# Patient Record
Sex: Female | Born: 1945 | Race: White | Hispanic: No | Smoking: Former smoker
Health system: Southern US, Community
[De-identification: ages and names within clinical notes are randomized; demographics above are authoritative.]

## PROBLEM LIST (undated history)

## (undated) DIAGNOSIS — I679 Cerebrovascular disease, unspecified: Secondary | ICD-10-CM

## (undated) DIAGNOSIS — E7800 Pure hypercholesterolemia, unspecified: Secondary | ICD-10-CM

## (undated) DIAGNOSIS — E785 Hyperlipidemia, unspecified: Secondary | ICD-10-CM

## (undated) DIAGNOSIS — M1990 Unspecified osteoarthritis, unspecified site: Secondary | ICD-10-CM

## (undated) DIAGNOSIS — K219 Gastro-esophageal reflux disease without esophagitis: Secondary | ICD-10-CM

## (undated) DIAGNOSIS — C9111 Chronic lymphocytic leukemia of B-cell type in remission: Secondary | ICD-10-CM

## (undated) HISTORY — DX: Hyperlipidemia, unspecified: E78.5

## (undated) HISTORY — DX: Unspecified osteoarthritis, unspecified site: M19.90

## (undated) HISTORY — DX: Cerebrovascular disease, unspecified: I67.9

## (undated) HISTORY — DX: Chronic lymphocytic leukemia of B-cell type in remission: C91.11

## (undated) HISTORY — DX: Gastro-esophageal reflux disease without esophagitis: K21.9

## (undated) HISTORY — PX: BACK SURGERY: SHX140

---
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
## 2004-10-14 ENCOUNTER — Ambulatory Visit: Payer: Self-pay | Admitting: Internal Medicine

## 2005-01-13 ENCOUNTER — Ambulatory Visit: Payer: Self-pay | Admitting: Internal Medicine

## 2005-05-23 ENCOUNTER — Ambulatory Visit: Payer: Self-pay | Admitting: Internal Medicine

## 2005-08-08 ENCOUNTER — Ambulatory Visit (HOSPITAL_COMMUNITY): Admission: RE | Admit: 2005-08-08 | Discharge: 2005-08-08 | Payer: Self-pay | Admitting: Ophthalmology

## 2005-08-25 ENCOUNTER — Ambulatory Visit: Payer: Self-pay | Admitting: Internal Medicine

## 2006-08-28 ENCOUNTER — Ambulatory Visit (HOSPITAL_COMMUNITY): Admission: RE | Admit: 2006-08-28 | Discharge: 2006-08-28 | Payer: Self-pay | Admitting: Ophthalmology

## 2006-09-12 ENCOUNTER — Ambulatory Visit: Payer: Self-pay | Admitting: Internal Medicine

## 2010-08-24 NOTE — Assessment & Plan Note (Signed)
Jenna Rasmussen, [name]                CHART#:  [id number]   DATE:  09/12/2006                       DOB:  [birth year]   PROBLEM LIST:  1. GERD/laryngopharyngeal reflux diagnosed by Dr.  Vladimir Faster at [facility] previously on 24 hour pH probe.  2. History of CLL in remission.  3. History of cough, possibly related to number 1.  4. History of recurrent pulmonary infection.  5. Colonoscopy.  6. Negative colonoscopy, Dr.  Elmyra Ricks, 2005.   Jenna Rasmussen was diagnosed with laryngopharyngeal reflux by Dr.  Vladimir Faster  previously.  She has responded to acid suppression therapy, finally, she  has been on omeprazole 20 mg orally b.i.d.  We tried to drop it back  several months ago to once a day, but she had recurrence of her typical  reflux symptoms and some cough for which we backed up the omeprazole 20  mg orally twice daily.  She has gained 2-1/2 pounds, which is really not  our of range for her body frame.  She is currently doing well and not  really having and ENT or esophageal symptoms.   In looking back, she did have an esophageal pH probe placed some 4 years  ago when over at [facility], but is unsure whether or not she had EGD at  that time.  Again, she is not having any odynophagia or dysphagia, no  melena or rectal bleeding.   CURRENT MEDICATIONS:  See updated list.   ALLERGIES:  Flagyl, codeine, rifampin.   EXAMINATION:  She appears well.  Weight 135, height 5 foot 1-1/2 inches,  temperature 97.9, BP 100/80, pulse 70.  SKIN:  Warm and dry.  CHEST:  Lungs are clear to auscultation bilaterally.  CARDIAC EXAM:  Regular rate and rhythm without  murmurs, rubs, or  gallops.  ABDOMEN:  Nondistended, positive bowel sounds, soft, nontender, without  appreciable mass or organomegaly.   ASSESSMENT:  History of LPR, GERD, symptoms now controlled on b.i.d.  omeprazole.  I told her that if she could drop down to about 125 pounds,  she may be able to back off to once daily  omeprazole, otherwise, she  will be pretty much stuck with taking it twice daily.  We discussed the  risk of long term proton pump inhibitor therapy including increased risk  of infection and thinning of bones, but in this situation I feel that  the benefits of this approach far outweigh the risks.  I have encouraged  her to lose a little weight to see if she cannot get by with once daily  therapy.  We will plan to see this nice lady back in one year.  I have  given her a refill on her omeprazole today.       Jonathon Bellows, M.D.  Electronically Signed     RMR/MEDQ  D:  09/12/2006  T:  09/12/2006  Job:  [id number]   cc:   Selinda Flavin

## 2011-01-17 ENCOUNTER — Telehealth (INDEPENDENT_AMBULATORY_CARE_PROVIDER_SITE_OTHER): Payer: Self-pay | Admitting: *Deleted

## 2011-01-17 NOTE — Telephone Encounter (Signed)
Patient called in and said that she needs to cancel and reschedule her for 10/18.  She needs a Monday.  She can be reached today at [phone]

## 2011-01-17 NOTE — Telephone Encounter (Signed)
lmom for patient to call.

## 2011-01-18 NOTE — Telephone Encounter (Signed)
Spoke to patient and advised her Dr Karilyn Cota didn't do procedures on Monday and she stated "just cancel it then", TCS has been canceled

## 2011-01-27 ENCOUNTER — Encounter (INDEPENDENT_AMBULATORY_CARE_PROVIDER_SITE_OTHER): Payer: Self-pay | Admitting: Internal Medicine

## 2011-01-27 ENCOUNTER — Ambulatory Visit (HOSPITAL_COMMUNITY)
Admission: RE | Admit: 2011-01-27 | Payer: BC Managed Care – PPO | Source: Ambulatory Visit | Admitting: Internal Medicine

## 2011-01-27 ENCOUNTER — Encounter (HOSPITAL_COMMUNITY): Admission: RE | Payer: Self-pay | Source: Ambulatory Visit

## 2011-01-27 SURGERY — COLONOSCOPY
Anesthesia: Moderate Sedation

## 2011-07-28 DIAGNOSIS — H401390 Pigmentary glaucoma, unspecified eye, stage unspecified: Secondary | ICD-10-CM | POA: Diagnosis not present

## 2011-11-24 DIAGNOSIS — K219 Gastro-esophageal reflux disease without esophagitis: Secondary | ICD-10-CM | POA: Diagnosis not present

## 2011-11-24 DIAGNOSIS — S30860A Insect bite (nonvenomous) of lower back and pelvis, initial encounter: Secondary | ICD-10-CM | POA: Diagnosis not present

## 2011-11-24 DIAGNOSIS — W57XXXA Bitten or stung by nonvenomous insect and other nonvenomous arthropods, initial encounter: Secondary | ICD-10-CM | POA: Diagnosis not present

## 2011-12-27 DIAGNOSIS — N3090 Cystitis, unspecified without hematuria: Secondary | ICD-10-CM | POA: Diagnosis not present

## 2012-01-19 DIAGNOSIS — E785 Hyperlipidemia, unspecified: Secondary | ICD-10-CM | POA: Diagnosis not present

## 2012-01-19 DIAGNOSIS — R5383 Other fatigue: Secondary | ICD-10-CM | POA: Diagnosis not present

## 2012-01-19 DIAGNOSIS — E7800 Pure hypercholesterolemia, unspecified: Secondary | ICD-10-CM | POA: Diagnosis not present

## 2012-01-19 DIAGNOSIS — R5381 Other malaise: Secondary | ICD-10-CM | POA: Diagnosis not present

## 2012-01-24 DIAGNOSIS — H4011X0 Primary open-angle glaucoma, stage unspecified: Secondary | ICD-10-CM | POA: Diagnosis not present

## 2012-01-26 DIAGNOSIS — Z23 Encounter for immunization: Secondary | ICD-10-CM | POA: Diagnosis not present

## 2012-01-26 DIAGNOSIS — Z0000 Encounter for general adult medical examination without abnormal findings: Secondary | ICD-10-CM | POA: Diagnosis not present

## 2012-01-26 DIAGNOSIS — Z124 Encounter for screening for malignant neoplasm of cervix: Secondary | ICD-10-CM | POA: Diagnosis not present

## 2012-02-23 DIAGNOSIS — Z8619 Personal history of other infectious and parasitic diseases: Secondary | ICD-10-CM | POA: Diagnosis not present

## 2012-02-23 DIAGNOSIS — K219 Gastro-esophageal reflux disease without esophagitis: Secondary | ICD-10-CM | POA: Diagnosis not present

## 2012-02-23 DIAGNOSIS — J479 Bronchiectasis, uncomplicated: Secondary | ICD-10-CM | POA: Diagnosis not present

## 2012-02-23 DIAGNOSIS — C9110 Chronic lymphocytic leukemia of B-cell type not having achieved remission: Secondary | ICD-10-CM | POA: Diagnosis not present

## 2012-02-24 DIAGNOSIS — B300 Keratoconjunctivitis due to adenovirus: Secondary | ICD-10-CM | POA: Diagnosis not present

## 2012-03-12 DIAGNOSIS — Z1231 Encounter for screening mammogram for malignant neoplasm of breast: Secondary | ICD-10-CM | POA: Diagnosis not present

## 2012-04-18 DIAGNOSIS — H4011X0 Primary open-angle glaucoma, stage unspecified: Secondary | ICD-10-CM | POA: Diagnosis not present

## 2012-05-11 DIAGNOSIS — N390 Urinary tract infection, site not specified: Secondary | ICD-10-CM | POA: Diagnosis not present

## 2012-05-11 DIAGNOSIS — R300 Dysuria: Secondary | ICD-10-CM | POA: Diagnosis not present

## 2012-08-09 DIAGNOSIS — J479 Bronchiectasis, uncomplicated: Secondary | ICD-10-CM | POA: Diagnosis not present

## 2012-08-09 DIAGNOSIS — C9110 Chronic lymphocytic leukemia of B-cell type not having achieved remission: Secondary | ICD-10-CM | POA: Diagnosis not present

## 2012-08-09 DIAGNOSIS — K219 Gastro-esophageal reflux disease without esophagitis: Secondary | ICD-10-CM | POA: Diagnosis not present

## 2012-08-10 DIAGNOSIS — S93609A Unspecified sprain of unspecified foot, initial encounter: Secondary | ICD-10-CM | POA: Diagnosis not present

## 2012-08-10 DIAGNOSIS — IMO0002 Reserved for concepts with insufficient information to code with codable children: Secondary | ICD-10-CM | POA: Diagnosis not present

## 2012-08-16 ENCOUNTER — Encounter: Payer: BC Managed Care – PPO | Admitting: Internal Medicine

## 2012-08-16 DIAGNOSIS — C9110 Chronic lymphocytic leukemia of B-cell type not having achieved remission: Secondary | ICD-10-CM

## 2012-08-30 DIAGNOSIS — M1990 Unspecified osteoarthritis, unspecified site: Secondary | ICD-10-CM | POA: Diagnosis not present

## 2012-08-30 DIAGNOSIS — M79609 Pain in unspecified limb: Secondary | ICD-10-CM | POA: Diagnosis not present

## 2012-08-30 DIAGNOSIS — M76899 Other specified enthesopathies of unspecified lower limb, excluding foot: Secondary | ICD-10-CM | POA: Diagnosis not present

## 2012-09-17 DIAGNOSIS — N3090 Cystitis, unspecified without hematuria: Secondary | ICD-10-CM | POA: Diagnosis not present

## 2012-10-24 DIAGNOSIS — M76899 Other specified enthesopathies of unspecified lower limb, excluding foot: Secondary | ICD-10-CM | POA: Diagnosis not present

## 2012-10-24 DIAGNOSIS — M659 Synovitis and tenosynovitis, unspecified: Secondary | ICD-10-CM | POA: Diagnosis not present

## 2013-01-03 DIAGNOSIS — H4011X0 Primary open-angle glaucoma, stage unspecified: Secondary | ICD-10-CM | POA: Diagnosis not present

## 2013-01-14 DIAGNOSIS — Z23 Encounter for immunization: Secondary | ICD-10-CM | POA: Diagnosis not present

## 2013-02-01 DIAGNOSIS — IMO0001 Reserved for inherently not codable concepts without codable children: Secondary | ICD-10-CM | POA: Diagnosis not present

## 2013-02-01 DIAGNOSIS — M25559 Pain in unspecified hip: Secondary | ICD-10-CM | POA: Diagnosis not present

## 2013-02-06 DIAGNOSIS — IMO0001 Reserved for inherently not codable concepts without codable children: Secondary | ICD-10-CM | POA: Diagnosis not present

## 2013-02-06 DIAGNOSIS — M25559 Pain in unspecified hip: Secondary | ICD-10-CM | POA: Diagnosis not present

## 2013-02-08 DIAGNOSIS — M25559 Pain in unspecified hip: Secondary | ICD-10-CM | POA: Diagnosis not present

## 2013-02-08 DIAGNOSIS — IMO0001 Reserved for inherently not codable concepts without codable children: Secondary | ICD-10-CM | POA: Diagnosis not present

## 2013-02-11 DIAGNOSIS — M76899 Other specified enthesopathies of unspecified lower limb, excluding foot: Secondary | ICD-10-CM | POA: Diagnosis not present

## 2013-02-11 DIAGNOSIS — IMO0001 Reserved for inherently not codable concepts without codable children: Secondary | ICD-10-CM | POA: Diagnosis not present

## 2013-02-15 DIAGNOSIS — M76899 Other specified enthesopathies of unspecified lower limb, excluding foot: Secondary | ICD-10-CM | POA: Diagnosis not present

## 2013-02-15 DIAGNOSIS — IMO0001 Reserved for inherently not codable concepts without codable children: Secondary | ICD-10-CM | POA: Diagnosis not present

## 2013-02-18 DIAGNOSIS — IMO0001 Reserved for inherently not codable concepts without codable children: Secondary | ICD-10-CM | POA: Diagnosis not present

## 2013-02-18 DIAGNOSIS — M76899 Other specified enthesopathies of unspecified lower limb, excluding foot: Secondary | ICD-10-CM | POA: Diagnosis not present

## 2013-02-27 DIAGNOSIS — R05 Cough: Secondary | ICD-10-CM | POA: Diagnosis not present

## 2013-02-27 DIAGNOSIS — J040 Acute laryngitis: Secondary | ICD-10-CM | POA: Diagnosis not present

## 2013-02-27 DIAGNOSIS — J209 Acute bronchitis, unspecified: Secondary | ICD-10-CM | POA: Diagnosis not present

## 2013-02-28 DIAGNOSIS — IMO0001 Reserved for inherently not codable concepts without codable children: Secondary | ICD-10-CM | POA: Diagnosis not present

## 2013-02-28 DIAGNOSIS — M76899 Other specified enthesopathies of unspecified lower limb, excluding foot: Secondary | ICD-10-CM | POA: Diagnosis not present

## 2013-03-04 DIAGNOSIS — M76899 Other specified enthesopathies of unspecified lower limb, excluding foot: Secondary | ICD-10-CM | POA: Diagnosis not present

## 2013-03-04 DIAGNOSIS — IMO0001 Reserved for inherently not codable concepts without codable children: Secondary | ICD-10-CM | POA: Diagnosis not present

## 2013-03-05 DIAGNOSIS — IMO0001 Reserved for inherently not codable concepts without codable children: Secondary | ICD-10-CM | POA: Diagnosis not present

## 2013-03-05 DIAGNOSIS — M76899 Other specified enthesopathies of unspecified lower limb, excluding foot: Secondary | ICD-10-CM | POA: Diagnosis not present

## 2013-03-12 DIAGNOSIS — IMO0001 Reserved for inherently not codable concepts without codable children: Secondary | ICD-10-CM | POA: Diagnosis not present

## 2013-03-12 DIAGNOSIS — M25569 Pain in unspecified knee: Secondary | ICD-10-CM | POA: Diagnosis not present

## 2013-03-25 DIAGNOSIS — J209 Acute bronchitis, unspecified: Secondary | ICD-10-CM | POA: Diagnosis not present

## 2013-03-25 DIAGNOSIS — J040 Acute laryngitis: Secondary | ICD-10-CM | POA: Diagnosis not present

## 2013-03-25 DIAGNOSIS — R05 Cough: Secondary | ICD-10-CM | POA: Diagnosis not present

## 2013-04-22 DIAGNOSIS — M25559 Pain in unspecified hip: Secondary | ICD-10-CM | POA: Diagnosis not present

## 2013-04-22 DIAGNOSIS — M76899 Other specified enthesopathies of unspecified lower limb, excluding foot: Secondary | ICD-10-CM | POA: Diagnosis not present

## 2013-04-30 DIAGNOSIS — IMO0001 Reserved for inherently not codable concepts without codable children: Secondary | ICD-10-CM | POA: Diagnosis not present

## 2013-04-30 DIAGNOSIS — M25569 Pain in unspecified knee: Secondary | ICD-10-CM | POA: Diagnosis not present

## 2013-05-02 DIAGNOSIS — M25569 Pain in unspecified knee: Secondary | ICD-10-CM | POA: Diagnosis not present

## 2013-05-02 DIAGNOSIS — IMO0001 Reserved for inherently not codable concepts without codable children: Secondary | ICD-10-CM | POA: Diagnosis not present

## 2013-05-07 DIAGNOSIS — M25569 Pain in unspecified knee: Secondary | ICD-10-CM | POA: Diagnosis not present

## 2013-05-07 DIAGNOSIS — IMO0001 Reserved for inherently not codable concepts without codable children: Secondary | ICD-10-CM | POA: Diagnosis not present

## 2013-05-09 DIAGNOSIS — IMO0001 Reserved for inherently not codable concepts without codable children: Secondary | ICD-10-CM | POA: Diagnosis not present

## 2013-05-09 DIAGNOSIS — M25569 Pain in unspecified knee: Secondary | ICD-10-CM | POA: Diagnosis not present

## 2013-05-14 DIAGNOSIS — M25569 Pain in unspecified knee: Secondary | ICD-10-CM | POA: Diagnosis not present

## 2013-05-14 DIAGNOSIS — IMO0001 Reserved for inherently not codable concepts without codable children: Secondary | ICD-10-CM | POA: Diagnosis not present

## 2013-05-16 DIAGNOSIS — M25569 Pain in unspecified knee: Secondary | ICD-10-CM | POA: Diagnosis not present

## 2013-05-16 DIAGNOSIS — IMO0001 Reserved for inherently not codable concepts without codable children: Secondary | ICD-10-CM | POA: Diagnosis not present

## 2013-05-21 DIAGNOSIS — IMO0001 Reserved for inherently not codable concepts without codable children: Secondary | ICD-10-CM | POA: Diagnosis not present

## 2013-05-21 DIAGNOSIS — M25569 Pain in unspecified knee: Secondary | ICD-10-CM | POA: Diagnosis not present

## 2013-05-23 DIAGNOSIS — M25569 Pain in unspecified knee: Secondary | ICD-10-CM | POA: Diagnosis not present

## 2013-05-23 DIAGNOSIS — IMO0001 Reserved for inherently not codable concepts without codable children: Secondary | ICD-10-CM | POA: Diagnosis not present

## 2013-05-30 DIAGNOSIS — IMO0001 Reserved for inherently not codable concepts without codable children: Secondary | ICD-10-CM | POA: Diagnosis not present

## 2013-05-30 DIAGNOSIS — M25569 Pain in unspecified knee: Secondary | ICD-10-CM | POA: Diagnosis not present

## 2013-06-03 DIAGNOSIS — M76899 Other specified enthesopathies of unspecified lower limb, excluding foot: Secondary | ICD-10-CM | POA: Diagnosis not present

## 2013-06-03 DIAGNOSIS — M25559 Pain in unspecified hip: Secondary | ICD-10-CM | POA: Diagnosis not present

## 2013-06-14 DIAGNOSIS — J019 Acute sinusitis, unspecified: Secondary | ICD-10-CM | POA: Diagnosis not present

## 2013-06-24 DIAGNOSIS — R059 Cough, unspecified: Secondary | ICD-10-CM | POA: Diagnosis not present

## 2013-06-24 DIAGNOSIS — C9110 Chronic lymphocytic leukemia of B-cell type not having achieved remission: Secondary | ICD-10-CM | POA: Diagnosis not present

## 2013-06-24 DIAGNOSIS — R05 Cough: Secondary | ICD-10-CM | POA: Diagnosis not present

## 2013-06-24 DIAGNOSIS — J40 Bronchitis, not specified as acute or chronic: Secondary | ICD-10-CM | POA: Diagnosis not present

## 2013-06-24 DIAGNOSIS — K219 Gastro-esophageal reflux disease without esophagitis: Secondary | ICD-10-CM | POA: Diagnosis not present

## 2013-06-24 DIAGNOSIS — R509 Fever, unspecified: Secondary | ICD-10-CM | POA: Diagnosis not present

## 2013-07-02 DIAGNOSIS — J984 Other disorders of lung: Secondary | ICD-10-CM | POA: Diagnosis not present

## 2013-07-02 DIAGNOSIS — C9110 Chronic lymphocytic leukemia of B-cell type not having achieved remission: Secondary | ICD-10-CM | POA: Diagnosis not present

## 2013-07-02 DIAGNOSIS — Z0189 Encounter for other specified special examinations: Secondary | ICD-10-CM | POA: Diagnosis not present

## 2013-07-02 DIAGNOSIS — R918 Other nonspecific abnormal finding of lung field: Secondary | ICD-10-CM | POA: Diagnosis not present

## 2013-07-02 DIAGNOSIS — A318 Other mycobacterial infections: Secondary | ICD-10-CM | POA: Diagnosis not present

## 2013-07-02 DIAGNOSIS — R059 Cough, unspecified: Secondary | ICD-10-CM | POA: Diagnosis not present

## 2013-07-03 DIAGNOSIS — Z1231 Encounter for screening mammogram for malignant neoplasm of breast: Secondary | ICD-10-CM | POA: Diagnosis not present

## 2013-07-09 DIAGNOSIS — R9389 Abnormal findings on diagnostic imaging of other specified body structures: Secondary | ICD-10-CM | POA: Diagnosis not present

## 2013-07-09 DIAGNOSIS — J479 Bronchiectasis, uncomplicated: Secondary | ICD-10-CM | POA: Diagnosis not present

## 2013-07-22 DIAGNOSIS — J189 Pneumonia, unspecified organism: Secondary | ICD-10-CM | POA: Diagnosis not present

## 2013-07-22 DIAGNOSIS — J479 Bronchiectasis, uncomplicated: Secondary | ICD-10-CM | POA: Diagnosis not present

## 2013-08-19 DIAGNOSIS — J479 Bronchiectasis, uncomplicated: Secondary | ICD-10-CM | POA: Diagnosis not present

## 2013-08-19 DIAGNOSIS — R071 Chest pain on breathing: Secondary | ICD-10-CM | POA: Diagnosis not present

## 2013-09-04 DIAGNOSIS — M76899 Other specified enthesopathies of unspecified lower limb, excluding foot: Secondary | ICD-10-CM | POA: Diagnosis not present

## 2013-09-13 DIAGNOSIS — Z79899 Other long term (current) drug therapy: Secondary | ICD-10-CM | POA: Diagnosis not present

## 2013-09-13 DIAGNOSIS — Z0000 Encounter for general adult medical examination without abnormal findings: Secondary | ICD-10-CM | POA: Diagnosis not present

## 2013-09-13 DIAGNOSIS — E785 Hyperlipidemia, unspecified: Secondary | ICD-10-CM | POA: Diagnosis not present

## 2013-09-13 DIAGNOSIS — E039 Hypothyroidism, unspecified: Secondary | ICD-10-CM | POA: Diagnosis not present

## 2013-09-16 DIAGNOSIS — S6990XA Unspecified injury of unspecified wrist, hand and finger(s), initial encounter: Secondary | ICD-10-CM | POA: Diagnosis not present

## 2013-09-16 DIAGNOSIS — S60229A Contusion of unspecified hand, initial encounter: Secondary | ICD-10-CM | POA: Diagnosis not present

## 2013-09-16 DIAGNOSIS — M545 Low back pain, unspecified: Secondary | ICD-10-CM | POA: Diagnosis not present

## 2013-09-16 DIAGNOSIS — M79609 Pain in unspecified limb: Secondary | ICD-10-CM | POA: Diagnosis not present

## 2013-09-16 DIAGNOSIS — IMO0002 Reserved for concepts with insufficient information to code with codable children: Secondary | ICD-10-CM | POA: Diagnosis not present

## 2013-09-20 DIAGNOSIS — C9110 Chronic lymphocytic leukemia of B-cell type not having achieved remission: Secondary | ICD-10-CM | POA: Diagnosis not present

## 2013-09-20 DIAGNOSIS — E7800 Pure hypercholesterolemia, unspecified: Secondary | ICD-10-CM | POA: Diagnosis not present

## 2013-09-20 DIAGNOSIS — T07XXXA Unspecified multiple injuries, initial encounter: Secondary | ICD-10-CM | POA: Diagnosis not present

## 2013-09-20 DIAGNOSIS — Z0000 Encounter for general adult medical examination without abnormal findings: Secondary | ICD-10-CM | POA: Diagnosis not present

## 2013-09-20 DIAGNOSIS — J309 Allergic rhinitis, unspecified: Secondary | ICD-10-CM | POA: Diagnosis not present

## 2013-09-20 DIAGNOSIS — K219 Gastro-esophageal reflux disease without esophagitis: Secondary | ICD-10-CM | POA: Diagnosis not present

## 2013-09-20 DIAGNOSIS — J479 Bronchiectasis, uncomplicated: Secondary | ICD-10-CM | POA: Diagnosis not present

## 2013-09-26 DIAGNOSIS — T148XXA Other injury of unspecified body region, initial encounter: Secondary | ICD-10-CM | POA: Diagnosis not present

## 2013-10-21 DIAGNOSIS — T148XXA Other injury of unspecified body region, initial encounter: Secondary | ICD-10-CM | POA: Diagnosis not present

## 2013-10-21 DIAGNOSIS — N644 Mastodynia: Secondary | ICD-10-CM | POA: Diagnosis not present

## 2013-10-25 DIAGNOSIS — R9389 Abnormal findings on diagnostic imaging of other specified body structures: Secondary | ICD-10-CM | POA: Diagnosis not present

## 2013-10-25 DIAGNOSIS — R918 Other nonspecific abnormal finding of lung field: Secondary | ICD-10-CM | POA: Diagnosis not present

## 2013-10-25 DIAGNOSIS — R059 Cough, unspecified: Secondary | ICD-10-CM | POA: Diagnosis not present

## 2013-10-25 DIAGNOSIS — R05 Cough: Secondary | ICD-10-CM | POA: Diagnosis not present

## 2013-10-25 DIAGNOSIS — J479 Bronchiectasis, uncomplicated: Secondary | ICD-10-CM | POA: Diagnosis not present

## 2013-10-28 DIAGNOSIS — J479 Bronchiectasis, uncomplicated: Secondary | ICD-10-CM | POA: Diagnosis not present

## 2013-10-30 DIAGNOSIS — R922 Inconclusive mammogram: Secondary | ICD-10-CM | POA: Diagnosis not present

## 2013-10-30 DIAGNOSIS — S2000XA Contusion of breast, unspecified breast, initial encounter: Secondary | ICD-10-CM | POA: Diagnosis not present

## 2013-10-30 DIAGNOSIS — N63 Unspecified lump in unspecified breast: Secondary | ICD-10-CM | POA: Diagnosis not present

## 2013-11-20 DIAGNOSIS — J479 Bronchiectasis, uncomplicated: Secondary | ICD-10-CM | POA: Diagnosis not present

## 2013-12-03 DIAGNOSIS — Z79899 Other long term (current) drug therapy: Secondary | ICD-10-CM | POA: Diagnosis not present

## 2013-12-03 DIAGNOSIS — C9110 Chronic lymphocytic leukemia of B-cell type not having achieved remission: Secondary | ICD-10-CM | POA: Diagnosis not present

## 2013-12-03 DIAGNOSIS — R918 Other nonspecific abnormal finding of lung field: Secondary | ICD-10-CM | POA: Diagnosis not present

## 2013-12-03 DIAGNOSIS — J189 Pneumonia, unspecified organism: Secondary | ICD-10-CM | POA: Diagnosis not present

## 2013-12-03 DIAGNOSIS — E7800 Pure hypercholesterolemia, unspecified: Secondary | ICD-10-CM | POA: Diagnosis not present

## 2013-12-03 DIAGNOSIS — Z87891 Personal history of nicotine dependence: Secondary | ICD-10-CM | POA: Diagnosis not present

## 2013-12-03 DIAGNOSIS — M62838 Other muscle spasm: Secondary | ICD-10-CM | POA: Diagnosis not present

## 2013-12-03 DIAGNOSIS — K219 Gastro-esophageal reflux disease without esophagitis: Secondary | ICD-10-CM | POA: Diagnosis not present

## 2013-12-03 DIAGNOSIS — M1990 Unspecified osteoarthritis, unspecified site: Secondary | ICD-10-CM | POA: Diagnosis not present

## 2013-12-03 DIAGNOSIS — J479 Bronchiectasis, uncomplicated: Secondary | ICD-10-CM | POA: Diagnosis not present

## 2013-12-03 DIAGNOSIS — J209 Acute bronchitis, unspecified: Secondary | ICD-10-CM | POA: Diagnosis not present

## 2013-12-05 DIAGNOSIS — K122 Cellulitis and abscess of mouth: Secondary | ICD-10-CM | POA: Diagnosis not present

## 2013-12-07 DIAGNOSIS — C9110 Chronic lymphocytic leukemia of B-cell type not having achieved remission: Secondary | ICD-10-CM | POA: Diagnosis not present

## 2013-12-07 DIAGNOSIS — J479 Bronchiectasis, uncomplicated: Secondary | ICD-10-CM | POA: Diagnosis not present

## 2013-12-07 DIAGNOSIS — K122 Cellulitis and abscess of mouth: Secondary | ICD-10-CM | POA: Diagnosis not present

## 2013-12-18 DIAGNOSIS — J479 Bronchiectasis, uncomplicated: Secondary | ICD-10-CM | POA: Diagnosis not present

## 2013-12-20 DIAGNOSIS — E7800 Pure hypercholesterolemia, unspecified: Secondary | ICD-10-CM | POA: Diagnosis not present

## 2013-12-20 DIAGNOSIS — N3090 Cystitis, unspecified without hematuria: Secondary | ICD-10-CM | POA: Diagnosis not present

## 2013-12-20 DIAGNOSIS — C9110 Chronic lymphocytic leukemia of B-cell type not having achieved remission: Secondary | ICD-10-CM | POA: Diagnosis not present

## 2013-12-20 DIAGNOSIS — K219 Gastro-esophageal reflux disease without esophagitis: Secondary | ICD-10-CM | POA: Diagnosis not present

## 2013-12-26 DIAGNOSIS — Z23 Encounter for immunization: Secondary | ICD-10-CM | POA: Diagnosis not present

## 2013-12-26 DIAGNOSIS — C9110 Chronic lymphocytic leukemia of B-cell type not having achieved remission: Secondary | ICD-10-CM | POA: Diagnosis not present

## 2013-12-26 DIAGNOSIS — E7800 Pure hypercholesterolemia, unspecified: Secondary | ICD-10-CM | POA: Diagnosis not present

## 2013-12-26 DIAGNOSIS — J479 Bronchiectasis, uncomplicated: Secondary | ICD-10-CM | POA: Diagnosis not present

## 2014-01-16 DIAGNOSIS — R05 Cough: Secondary | ICD-10-CM | POA: Diagnosis not present

## 2014-01-16 DIAGNOSIS — J0120 Acute ethmoidal sinusitis, unspecified: Secondary | ICD-10-CM | POA: Diagnosis not present

## 2014-01-21 DIAGNOSIS — J479 Bronchiectasis, uncomplicated: Secondary | ICD-10-CM | POA: Diagnosis not present

## 2014-02-21 DIAGNOSIS — R5383 Other fatigue: Secondary | ICD-10-CM | POA: Diagnosis not present

## 2014-02-21 DIAGNOSIS — J40 Bronchitis, not specified as acute or chronic: Secondary | ICD-10-CM | POA: Diagnosis not present

## 2014-03-10 DIAGNOSIS — M2660 Temporomandibular joint disorder, unspecified: Secondary | ICD-10-CM | POA: Diagnosis not present

## 2014-03-10 DIAGNOSIS — J0120 Acute ethmoidal sinusitis, unspecified: Secondary | ICD-10-CM | POA: Diagnosis not present

## 2014-03-10 DIAGNOSIS — J40 Bronchitis, not specified as acute or chronic: Secondary | ICD-10-CM | POA: Diagnosis not present

## 2014-03-13 DIAGNOSIS — M2662 Arthralgia of temporomandibular joint: Secondary | ICD-10-CM | POA: Diagnosis not present

## 2014-03-13 DIAGNOSIS — J3489 Other specified disorders of nose and nasal sinuses: Secondary | ICD-10-CM | POA: Diagnosis not present

## 2014-03-13 DIAGNOSIS — J018 Other acute sinusitis: Secondary | ICD-10-CM | POA: Diagnosis not present

## 2014-03-13 DIAGNOSIS — J328 Other chronic sinusitis: Secondary | ICD-10-CM | POA: Diagnosis not present

## 2014-03-13 DIAGNOSIS — M2669 Other specified disorders of temporomandibular joint: Secondary | ICD-10-CM | POA: Diagnosis not present

## 2014-03-24 DIAGNOSIS — M7061 Trochanteric bursitis, right hip: Secondary | ICD-10-CM | POA: Diagnosis not present

## 2014-03-25 DIAGNOSIS — H4011X1 Primary open-angle glaucoma, mild stage: Secondary | ICD-10-CM | POA: Diagnosis not present

## 2014-03-25 DIAGNOSIS — H4011X0 Primary open-angle glaucoma, stage unspecified: Secondary | ICD-10-CM | POA: Diagnosis not present

## 2014-05-06 DIAGNOSIS — J479 Bronchiectasis, uncomplicated: Secondary | ICD-10-CM | POA: Diagnosis not present

## 2014-06-23 DIAGNOSIS — K219 Gastro-esophageal reflux disease without esophagitis: Secondary | ICD-10-CM | POA: Diagnosis not present

## 2014-06-23 DIAGNOSIS — E780 Pure hypercholesterolemia: Secondary | ICD-10-CM | POA: Diagnosis not present

## 2014-07-02 DIAGNOSIS — K219 Gastro-esophageal reflux disease without esophagitis: Secondary | ICD-10-CM | POA: Diagnosis not present

## 2014-07-02 DIAGNOSIS — J471 Bronchiectasis with (acute) exacerbation: Secondary | ICD-10-CM | POA: Diagnosis not present

## 2014-07-02 DIAGNOSIS — E780 Pure hypercholesterolemia: Secondary | ICD-10-CM | POA: Diagnosis not present

## 2014-09-01 DIAGNOSIS — R300 Dysuria: Secondary | ICD-10-CM | POA: Diagnosis not present

## 2014-09-01 DIAGNOSIS — R350 Frequency of micturition: Secondary | ICD-10-CM | POA: Diagnosis not present

## 2014-10-31 DIAGNOSIS — K219 Gastro-esophageal reflux disease without esophagitis: Secondary | ICD-10-CM | POA: Diagnosis not present

## 2014-10-31 DIAGNOSIS — C9110 Chronic lymphocytic leukemia of B-cell type not having achieved remission: Secondary | ICD-10-CM | POA: Diagnosis not present

## 2014-10-31 DIAGNOSIS — R5383 Other fatigue: Secondary | ICD-10-CM | POA: Diagnosis not present

## 2014-10-31 DIAGNOSIS — E780 Pure hypercholesterolemia: Secondary | ICD-10-CM | POA: Diagnosis not present

## 2014-10-31 DIAGNOSIS — Z0000 Encounter for general adult medical examination without abnormal findings: Secondary | ICD-10-CM | POA: Diagnosis not present

## 2014-11-03 DIAGNOSIS — K219 Gastro-esophageal reflux disease without esophagitis: Secondary | ICD-10-CM | POA: Diagnosis not present

## 2014-11-03 DIAGNOSIS — H6122 Impacted cerumen, left ear: Secondary | ICD-10-CM | POA: Diagnosis not present

## 2014-11-03 DIAGNOSIS — Z0000 Encounter for general adult medical examination without abnormal findings: Secondary | ICD-10-CM | POA: Diagnosis not present

## 2014-11-11 DIAGNOSIS — Z1231 Encounter for screening mammogram for malignant neoplasm of breast: Secondary | ICD-10-CM | POA: Diagnosis not present

## 2014-11-13 DIAGNOSIS — H6122 Impacted cerumen, left ear: Secondary | ICD-10-CM | POA: Diagnosis not present

## 2014-11-13 DIAGNOSIS — J309 Allergic rhinitis, unspecified: Secondary | ICD-10-CM | POA: Diagnosis not present

## 2014-12-23 DIAGNOSIS — J471 Bronchiectasis with (acute) exacerbation: Secondary | ICD-10-CM | POA: Diagnosis not present

## 2014-12-23 DIAGNOSIS — J019 Acute sinusitis, unspecified: Secondary | ICD-10-CM | POA: Diagnosis not present

## 2015-01-19 DIAGNOSIS — M7061 Trochanteric bursitis, right hip: Secondary | ICD-10-CM | POA: Diagnosis not present

## 2015-01-28 DIAGNOSIS — E7800 Pure hypercholesterolemia, unspecified: Secondary | ICD-10-CM | POA: Diagnosis not present

## 2015-02-04 DIAGNOSIS — Z13220 Encounter for screening for lipoid disorders: Secondary | ICD-10-CM | POA: Diagnosis not present

## 2015-02-04 DIAGNOSIS — Z1389 Encounter for screening for other disorder: Secondary | ICD-10-CM | POA: Diagnosis not present

## 2015-02-04 DIAGNOSIS — Z23 Encounter for immunization: Secondary | ICD-10-CM | POA: Diagnosis not present

## 2015-02-04 DIAGNOSIS — J471 Bronchiectasis with (acute) exacerbation: Secondary | ICD-10-CM | POA: Diagnosis not present

## 2015-02-04 DIAGNOSIS — K219 Gastro-esophageal reflux disease without esophagitis: Secondary | ICD-10-CM | POA: Diagnosis not present

## 2015-02-04 DIAGNOSIS — J309 Allergic rhinitis, unspecified: Secondary | ICD-10-CM | POA: Diagnosis not present

## 2015-03-02 DIAGNOSIS — M19041 Primary osteoarthritis, right hand: Secondary | ICD-10-CM | POA: Diagnosis not present

## 2015-03-02 DIAGNOSIS — M79644 Pain in right finger(s): Secondary | ICD-10-CM | POA: Diagnosis not present

## 2015-04-03 DIAGNOSIS — J019 Acute sinusitis, unspecified: Secondary | ICD-10-CM | POA: Diagnosis not present

## 2015-04-03 DIAGNOSIS — C9111 Chronic lymphocytic leukemia of B-cell type in remission: Secondary | ICD-10-CM | POA: Diagnosis not present

## 2015-04-03 DIAGNOSIS — J471 Bronchiectasis with (acute) exacerbation: Secondary | ICD-10-CM | POA: Diagnosis not present

## 2015-04-20 DIAGNOSIS — M25551 Pain in right hip: Secondary | ICD-10-CM | POA: Diagnosis not present

## 2015-04-20 DIAGNOSIS — M7061 Trochanteric bursitis, right hip: Secondary | ICD-10-CM | POA: Diagnosis not present

## 2015-05-29 DIAGNOSIS — E7800 Pure hypercholesterolemia, unspecified: Secondary | ICD-10-CM | POA: Diagnosis not present

## 2015-05-29 DIAGNOSIS — R5383 Other fatigue: Secondary | ICD-10-CM | POA: Diagnosis not present

## 2015-05-29 DIAGNOSIS — K219 Gastro-esophageal reflux disease without esophagitis: Secondary | ICD-10-CM | POA: Diagnosis not present

## 2015-05-29 DIAGNOSIS — C9110 Chronic lymphocytic leukemia of B-cell type not having achieved remission: Secondary | ICD-10-CM | POA: Diagnosis not present

## 2015-06-02 DIAGNOSIS — J40 Bronchitis, not specified as acute or chronic: Secondary | ICD-10-CM | POA: Diagnosis not present

## 2015-06-02 DIAGNOSIS — K219 Gastro-esophageal reflux disease without esophagitis: Secondary | ICD-10-CM | POA: Diagnosis not present

## 2015-06-18 DIAGNOSIS — M7061 Trochanteric bursitis, right hip: Secondary | ICD-10-CM | POA: Diagnosis not present

## 2015-07-15 DIAGNOSIS — H1045 Other chronic allergic conjunctivitis: Secondary | ICD-10-CM | POA: Diagnosis not present

## 2015-07-15 DIAGNOSIS — J309 Allergic rhinitis, unspecified: Secondary | ICD-10-CM | POA: Diagnosis not present

## 2015-07-22 DIAGNOSIS — H524 Presbyopia: Secondary | ICD-10-CM | POA: Diagnosis not present

## 2015-07-22 DIAGNOSIS — H401131 Primary open-angle glaucoma, bilateral, mild stage: Secondary | ICD-10-CM | POA: Diagnosis not present

## 2015-09-30 DIAGNOSIS — M7061 Trochanteric bursitis, right hip: Secondary | ICD-10-CM | POA: Diagnosis not present

## 2015-11-12 DIAGNOSIS — Z1231 Encounter for screening mammogram for malignant neoplasm of breast: Secondary | ICD-10-CM | POA: Diagnosis not present

## 2015-11-16 DIAGNOSIS — Z0000 Encounter for general adult medical examination without abnormal findings: Secondary | ICD-10-CM | POA: Diagnosis not present

## 2015-11-16 DIAGNOSIS — E7800 Pure hypercholesterolemia, unspecified: Secondary | ICD-10-CM | POA: Diagnosis not present

## 2015-11-16 DIAGNOSIS — C9110 Chronic lymphocytic leukemia of B-cell type not having achieved remission: Secondary | ICD-10-CM | POA: Diagnosis not present

## 2015-11-16 DIAGNOSIS — Z13220 Encounter for screening for lipoid disorders: Secondary | ICD-10-CM | POA: Diagnosis not present

## 2015-11-26 DIAGNOSIS — Z0000 Encounter for general adult medical examination without abnormal findings: Secondary | ICD-10-CM | POA: Diagnosis not present

## 2015-11-26 DIAGNOSIS — Z681 Body mass index (BMI) 19 or less, adult: Secondary | ICD-10-CM | POA: Diagnosis not present

## 2016-01-21 DIAGNOSIS — Z23 Encounter for immunization: Secondary | ICD-10-CM | POA: Diagnosis not present

## 2016-03-07 DIAGNOSIS — E7800 Pure hypercholesterolemia, unspecified: Secondary | ICD-10-CM | POA: Diagnosis not present

## 2016-03-10 DIAGNOSIS — K219 Gastro-esophageal reflux disease without esophagitis: Secondary | ICD-10-CM | POA: Diagnosis not present

## 2016-03-10 DIAGNOSIS — E7800 Pure hypercholesterolemia, unspecified: Secondary | ICD-10-CM | POA: Diagnosis not present

## 2016-06-06 DIAGNOSIS — E7800 Pure hypercholesterolemia, unspecified: Secondary | ICD-10-CM | POA: Diagnosis not present

## 2016-06-06 DIAGNOSIS — C9110 Chronic lymphocytic leukemia of B-cell type not having achieved remission: Secondary | ICD-10-CM | POA: Diagnosis not present

## 2016-06-06 DIAGNOSIS — K219 Gastro-esophageal reflux disease without esophagitis: Secondary | ICD-10-CM | POA: Diagnosis not present

## 2016-06-09 DIAGNOSIS — M720 Palmar fascial fibromatosis [Dupuytren]: Secondary | ICD-10-CM | POA: Diagnosis not present

## 2016-06-09 DIAGNOSIS — E7800 Pure hypercholesterolemia, unspecified: Secondary | ICD-10-CM | POA: Diagnosis not present

## 2016-06-09 DIAGNOSIS — M1991 Primary osteoarthritis, unspecified site: Secondary | ICD-10-CM | POA: Diagnosis not present

## 2016-06-09 DIAGNOSIS — Z681 Body mass index (BMI) 19 or less, adult: Secondary | ICD-10-CM | POA: Diagnosis not present

## 2016-06-09 DIAGNOSIS — K219 Gastro-esophageal reflux disease without esophagitis: Secondary | ICD-10-CM | POA: Diagnosis not present

## 2016-07-26 DIAGNOSIS — H524 Presbyopia: Secondary | ICD-10-CM | POA: Diagnosis not present

## 2016-07-26 DIAGNOSIS — H401131 Primary open-angle glaucoma, bilateral, mild stage: Secondary | ICD-10-CM | POA: Diagnosis not present

## 2016-08-29 DIAGNOSIS — H35363 Drusen (degenerative) of macula, bilateral: Secondary | ICD-10-CM | POA: Diagnosis not present

## 2016-08-29 DIAGNOSIS — H401134 Primary open-angle glaucoma, bilateral, indeterminate stage: Secondary | ICD-10-CM | POA: Diagnosis not present

## 2016-08-29 DIAGNOSIS — H25013 Cortical age-related cataract, bilateral: Secondary | ICD-10-CM | POA: Diagnosis not present

## 2016-08-29 DIAGNOSIS — H2513 Age-related nuclear cataract, bilateral: Secondary | ICD-10-CM | POA: Diagnosis not present

## 2016-08-29 DIAGNOSIS — H2512 Age-related nuclear cataract, left eye: Secondary | ICD-10-CM | POA: Diagnosis not present

## 2016-08-29 DIAGNOSIS — H25012 Cortical age-related cataract, left eye: Secondary | ICD-10-CM | POA: Diagnosis not present

## 2016-10-04 DIAGNOSIS — Z681 Body mass index (BMI) 19 or less, adult: Secondary | ICD-10-CM | POA: Diagnosis not present

## 2016-10-04 DIAGNOSIS — J019 Acute sinusitis, unspecified: Secondary | ICD-10-CM | POA: Diagnosis not present

## 2016-10-04 DIAGNOSIS — J471 Bronchiectasis with (acute) exacerbation: Secondary | ICD-10-CM | POA: Diagnosis not present

## 2016-10-19 DIAGNOSIS — M79641 Pain in right hand: Secondary | ICD-10-CM | POA: Diagnosis not present

## 2016-10-19 DIAGNOSIS — M1811 Unilateral primary osteoarthritis of first carpometacarpal joint, right hand: Secondary | ICD-10-CM | POA: Diagnosis not present

## 2016-10-19 DIAGNOSIS — M25551 Pain in right hip: Secondary | ICD-10-CM | POA: Diagnosis not present

## 2016-11-25 DIAGNOSIS — K219 Gastro-esophageal reflux disease without esophagitis: Secondary | ICD-10-CM | POA: Diagnosis not present

## 2016-11-25 DIAGNOSIS — Z0000 Encounter for general adult medical examination without abnormal findings: Secondary | ICD-10-CM | POA: Diagnosis not present

## 2016-11-25 DIAGNOSIS — Z681 Body mass index (BMI) 19 or less, adult: Secondary | ICD-10-CM | POA: Diagnosis not present

## 2016-11-25 DIAGNOSIS — C9110 Chronic lymphocytic leukemia of B-cell type not having achieved remission: Secondary | ICD-10-CM | POA: Diagnosis not present

## 2016-11-25 DIAGNOSIS — Z720 Tobacco use: Secondary | ICD-10-CM | POA: Diagnosis not present

## 2016-11-25 DIAGNOSIS — M1991 Primary osteoarthritis, unspecified site: Secondary | ICD-10-CM | POA: Diagnosis not present

## 2016-11-25 DIAGNOSIS — E7800 Pure hypercholesterolemia, unspecified: Secondary | ICD-10-CM | POA: Diagnosis not present

## 2016-11-30 DIAGNOSIS — M79672 Pain in left foot: Secondary | ICD-10-CM | POA: Diagnosis not present

## 2016-11-30 DIAGNOSIS — J309 Allergic rhinitis, unspecified: Secondary | ICD-10-CM | POA: Diagnosis not present

## 2016-11-30 DIAGNOSIS — Z681 Body mass index (BMI) 19 or less, adult: Secondary | ICD-10-CM | POA: Diagnosis not present

## 2016-12-30 DIAGNOSIS — Z1231 Encounter for screening mammogram for malignant neoplasm of breast: Secondary | ICD-10-CM | POA: Diagnosis not present

## 2017-01-25 DIAGNOSIS — Z23 Encounter for immunization: Secondary | ICD-10-CM | POA: Diagnosis not present

## 2017-03-07 DIAGNOSIS — Z681 Body mass index (BMI) 19 or less, adult: Secondary | ICD-10-CM | POA: Diagnosis not present

## 2017-03-07 DIAGNOSIS — J0101 Acute recurrent maxillary sinusitis: Secondary | ICD-10-CM | POA: Diagnosis not present

## 2017-03-07 DIAGNOSIS — J209 Acute bronchitis, unspecified: Secondary | ICD-10-CM | POA: Diagnosis not present

## 2017-05-24 DIAGNOSIS — R5383 Other fatigue: Secondary | ICD-10-CM | POA: Diagnosis not present

## 2017-05-24 DIAGNOSIS — E7800 Pure hypercholesterolemia, unspecified: Secondary | ICD-10-CM | POA: Diagnosis not present

## 2017-05-24 DIAGNOSIS — C9110 Chronic lymphocytic leukemia of B-cell type not having achieved remission: Secondary | ICD-10-CM | POA: Diagnosis not present

## 2017-05-24 DIAGNOSIS — K219 Gastro-esophageal reflux disease without esophagitis: Secondary | ICD-10-CM | POA: Diagnosis not present

## 2017-05-30 DIAGNOSIS — K219 Gastro-esophageal reflux disease without esophagitis: Secondary | ICD-10-CM | POA: Diagnosis not present

## 2017-05-30 DIAGNOSIS — E7800 Pure hypercholesterolemia, unspecified: Secondary | ICD-10-CM | POA: Diagnosis not present

## 2017-05-30 DIAGNOSIS — Z681 Body mass index (BMI) 19 or less, adult: Secondary | ICD-10-CM | POA: Diagnosis not present

## 2017-05-30 DIAGNOSIS — M1991 Primary osteoarthritis, unspecified site: Secondary | ICD-10-CM | POA: Diagnosis not present

## 2017-09-14 DIAGNOSIS — H40013 Open angle with borderline findings, low risk, bilateral: Secondary | ICD-10-CM | POA: Diagnosis not present

## 2017-09-14 DIAGNOSIS — H40019 Open angle with borderline findings, low risk, unspecified eye: Secondary | ICD-10-CM | POA: Diagnosis not present

## 2017-09-14 DIAGNOSIS — H524 Presbyopia: Secondary | ICD-10-CM | POA: Diagnosis not present

## 2017-11-06 DIAGNOSIS — M19072 Primary osteoarthritis, left ankle and foot: Secondary | ICD-10-CM | POA: Diagnosis not present

## 2017-11-06 DIAGNOSIS — Z681 Body mass index (BMI) 19 or less, adult: Secondary | ICD-10-CM | POA: Diagnosis not present

## 2017-11-06 DIAGNOSIS — M79672 Pain in left foot: Secondary | ICD-10-CM | POA: Diagnosis not present

## 2017-12-04 DIAGNOSIS — E7801 Familial hypercholesterolemia: Secondary | ICD-10-CM | POA: Diagnosis not present

## 2017-12-04 DIAGNOSIS — K219 Gastro-esophageal reflux disease without esophagitis: Secondary | ICD-10-CM | POA: Diagnosis not present

## 2017-12-04 DIAGNOSIS — C9110 Chronic lymphocytic leukemia of B-cell type not having achieved remission: Secondary | ICD-10-CM | POA: Diagnosis not present

## 2017-12-04 DIAGNOSIS — R5383 Other fatigue: Secondary | ICD-10-CM | POA: Diagnosis not present

## 2017-12-08 DIAGNOSIS — Z681 Body mass index (BMI) 19 or less, adult: Secondary | ICD-10-CM | POA: Diagnosis not present

## 2017-12-08 DIAGNOSIS — K219 Gastro-esophageal reflux disease without esophagitis: Secondary | ICD-10-CM | POA: Diagnosis not present

## 2017-12-08 DIAGNOSIS — C9111 Chronic lymphocytic leukemia of B-cell type in remission: Secondary | ICD-10-CM | POA: Diagnosis not present

## 2017-12-08 DIAGNOSIS — M19072 Primary osteoarthritis, left ankle and foot: Secondary | ICD-10-CM | POA: Diagnosis not present

## 2017-12-08 DIAGNOSIS — M1991 Primary osteoarthritis, unspecified site: Secondary | ICD-10-CM | POA: Diagnosis not present

## 2017-12-08 DIAGNOSIS — E7800 Pure hypercholesterolemia, unspecified: Secondary | ICD-10-CM | POA: Diagnosis not present

## 2017-12-22 DIAGNOSIS — Z0000 Encounter for general adult medical examination without abnormal findings: Secondary | ICD-10-CM | POA: Diagnosis not present

## 2017-12-22 DIAGNOSIS — E7801 Familial hypercholesterolemia: Secondary | ICD-10-CM | POA: Diagnosis not present

## 2017-12-22 DIAGNOSIS — Z681 Body mass index (BMI) 19 or less, adult: Secondary | ICD-10-CM | POA: Diagnosis not present

## 2018-01-04 DIAGNOSIS — M1811 Unilateral primary osteoarthritis of first carpometacarpal joint, right hand: Secondary | ICD-10-CM | POA: Diagnosis not present

## 2018-01-04 HISTORY — DX: Unilateral primary osteoarthritis of first carpometacarpal joint, right hand: M18.11

## 2018-01-31 DIAGNOSIS — Z23 Encounter for immunization: Secondary | ICD-10-CM | POA: Diagnosis not present

## 2018-02-26 DIAGNOSIS — J471 Bronchiectasis with (acute) exacerbation: Secondary | ICD-10-CM | POA: Diagnosis not present

## 2018-02-26 DIAGNOSIS — Z681 Body mass index (BMI) 19 or less, adult: Secondary | ICD-10-CM | POA: Diagnosis not present

## 2018-02-26 DIAGNOSIS — J019 Acute sinusitis, unspecified: Secondary | ICD-10-CM | POA: Diagnosis not present

## 2018-03-01 DIAGNOSIS — H40053 Ocular hypertension, bilateral: Secondary | ICD-10-CM | POA: Diagnosis not present

## 2018-03-01 DIAGNOSIS — H25013 Cortical age-related cataract, bilateral: Secondary | ICD-10-CM | POA: Diagnosis not present

## 2018-03-01 DIAGNOSIS — H401134 Primary open-angle glaucoma, bilateral, indeterminate stage: Secondary | ICD-10-CM | POA: Diagnosis not present

## 2018-03-01 DIAGNOSIS — H2513 Age-related nuclear cataract, bilateral: Secondary | ICD-10-CM | POA: Diagnosis not present

## 2018-03-01 DIAGNOSIS — H35363 Drusen (degenerative) of macula, bilateral: Secondary | ICD-10-CM | POA: Diagnosis not present

## 2018-04-17 DIAGNOSIS — H40013 Open angle with borderline findings, low risk, bilateral: Secondary | ICD-10-CM | POA: Diagnosis not present

## 2018-04-23 DIAGNOSIS — Z1231 Encounter for screening mammogram for malignant neoplasm of breast: Secondary | ICD-10-CM | POA: Diagnosis not present

## 2018-05-07 DIAGNOSIS — K219 Gastro-esophageal reflux disease without esophagitis: Secondary | ICD-10-CM | POA: Diagnosis not present

## 2018-05-07 DIAGNOSIS — E7801 Familial hypercholesterolemia: Secondary | ICD-10-CM | POA: Diagnosis not present

## 2018-05-07 DIAGNOSIS — R5383 Other fatigue: Secondary | ICD-10-CM | POA: Diagnosis not present

## 2018-05-07 DIAGNOSIS — E7800 Pure hypercholesterolemia, unspecified: Secondary | ICD-10-CM | POA: Diagnosis not present

## 2018-05-07 DIAGNOSIS — C9111 Chronic lymphocytic leukemia of B-cell type in remission: Secondary | ICD-10-CM | POA: Diagnosis not present

## 2018-05-10 DIAGNOSIS — Z681 Body mass index (BMI) 19 or less, adult: Secondary | ICD-10-CM | POA: Diagnosis not present

## 2018-05-10 DIAGNOSIS — K219 Gastro-esophageal reflux disease without esophagitis: Secondary | ICD-10-CM | POA: Diagnosis not present

## 2018-05-10 DIAGNOSIS — C9111 Chronic lymphocytic leukemia of B-cell type in remission: Secondary | ICD-10-CM | POA: Diagnosis not present

## 2018-05-10 DIAGNOSIS — E7800 Pure hypercholesterolemia, unspecified: Secondary | ICD-10-CM | POA: Diagnosis not present

## 2018-05-10 DIAGNOSIS — M1991 Primary osteoarthritis, unspecified site: Secondary | ICD-10-CM | POA: Diagnosis not present

## 2018-05-17 DIAGNOSIS — H401114 Primary open-angle glaucoma, right eye, indeterminate stage: Secondary | ICD-10-CM | POA: Diagnosis not present

## 2018-05-25 DIAGNOSIS — H9319 Tinnitus, unspecified ear: Secondary | ICD-10-CM | POA: Diagnosis not present

## 2018-05-25 DIAGNOSIS — H8190 Unspecified disorder of vestibular function, unspecified ear: Secondary | ICD-10-CM | POA: Diagnosis not present

## 2018-05-25 DIAGNOSIS — Z681 Body mass index (BMI) 19 or less, adult: Secondary | ICD-10-CM | POA: Diagnosis not present

## 2018-05-31 DIAGNOSIS — H401124 Primary open-angle glaucoma, left eye, indeterminate stage: Secondary | ICD-10-CM | POA: Diagnosis not present

## 2018-06-14 DIAGNOSIS — H8190 Unspecified disorder of vestibular function, unspecified ear: Secondary | ICD-10-CM | POA: Diagnosis not present

## 2018-06-14 DIAGNOSIS — Z681 Body mass index (BMI) 19 or less, adult: Secondary | ICD-10-CM | POA: Diagnosis not present

## 2018-06-14 DIAGNOSIS — H9319 Tinnitus, unspecified ear: Secondary | ICD-10-CM | POA: Diagnosis not present

## 2018-06-14 DIAGNOSIS — J471 Bronchiectasis with (acute) exacerbation: Secondary | ICD-10-CM | POA: Diagnosis not present

## 2018-06-15 DIAGNOSIS — Z79899 Other long term (current) drug therapy: Secondary | ICD-10-CM | POA: Diagnosis not present

## 2018-06-15 DIAGNOSIS — R42 Dizziness and giddiness: Secondary | ICD-10-CM | POA: Diagnosis not present

## 2018-06-15 DIAGNOSIS — Z7982 Long term (current) use of aspirin: Secondary | ICD-10-CM | POA: Diagnosis not present

## 2018-06-15 DIAGNOSIS — R110 Nausea: Secondary | ICD-10-CM | POA: Diagnosis not present

## 2018-06-15 DIAGNOSIS — H8193 Unspecified disorder of vestibular function, bilateral: Secondary | ICD-10-CM | POA: Diagnosis not present

## 2018-06-21 ENCOUNTER — Ambulatory Visit (INDEPENDENT_AMBULATORY_CARE_PROVIDER_SITE_OTHER): Payer: Medicare Other | Admitting: Otolaryngology

## 2018-06-21 DIAGNOSIS — H9313 Tinnitus, bilateral: Secondary | ICD-10-CM

## 2018-06-21 DIAGNOSIS — R42 Dizziness and giddiness: Secondary | ICD-10-CM | POA: Diagnosis not present

## 2018-06-21 DIAGNOSIS — H903 Sensorineural hearing loss, bilateral: Secondary | ICD-10-CM | POA: Diagnosis not present

## 2018-06-26 DIAGNOSIS — H903 Sensorineural hearing loss, bilateral: Secondary | ICD-10-CM | POA: Diagnosis not present

## 2018-06-26 DIAGNOSIS — R42 Dizziness and giddiness: Secondary | ICD-10-CM | POA: Diagnosis not present

## 2018-09-26 DIAGNOSIS — H401113 Primary open-angle glaucoma, right eye, severe stage: Secondary | ICD-10-CM | POA: Diagnosis not present

## 2018-09-26 DIAGNOSIS — H401122 Primary open-angle glaucoma, left eye, moderate stage: Secondary | ICD-10-CM | POA: Diagnosis not present

## 2018-09-26 DIAGNOSIS — H40053 Ocular hypertension, bilateral: Secondary | ICD-10-CM | POA: Diagnosis not present

## 2018-10-02 DIAGNOSIS — Z681 Body mass index (BMI) 19 or less, adult: Secondary | ICD-10-CM | POA: Diagnosis not present

## 2018-10-02 DIAGNOSIS — R634 Abnormal weight loss: Secondary | ICD-10-CM | POA: Diagnosis not present

## 2018-10-02 DIAGNOSIS — R5383 Other fatigue: Secondary | ICD-10-CM | POA: Diagnosis not present

## 2018-10-02 DIAGNOSIS — C9111 Chronic lymphocytic leukemia of B-cell type in remission: Secondary | ICD-10-CM | POA: Diagnosis not present

## 2018-10-02 DIAGNOSIS — J471 Bronchiectasis with (acute) exacerbation: Secondary | ICD-10-CM | POA: Diagnosis not present

## 2018-10-02 DIAGNOSIS — R05 Cough: Secondary | ICD-10-CM | POA: Diagnosis not present

## 2018-10-03 DIAGNOSIS — J471 Bronchiectasis with (acute) exacerbation: Secondary | ICD-10-CM | POA: Diagnosis not present

## 2018-12-07 DIAGNOSIS — R5383 Other fatigue: Secondary | ICD-10-CM | POA: Diagnosis not present

## 2018-12-07 DIAGNOSIS — J479 Bronchiectasis, uncomplicated: Secondary | ICD-10-CM | POA: Diagnosis not present

## 2018-12-07 DIAGNOSIS — R002 Palpitations: Secondary | ICD-10-CM | POA: Diagnosis not present

## 2018-12-07 DIAGNOSIS — E7800 Pure hypercholesterolemia, unspecified: Secondary | ICD-10-CM | POA: Diagnosis not present

## 2018-12-07 DIAGNOSIS — C9111 Chronic lymphocytic leukemia of B-cell type in remission: Secondary | ICD-10-CM | POA: Diagnosis not present

## 2018-12-07 DIAGNOSIS — K219 Gastro-esophageal reflux disease without esophagitis: Secondary | ICD-10-CM | POA: Diagnosis not present

## 2018-12-07 DIAGNOSIS — E7801 Familial hypercholesterolemia: Secondary | ICD-10-CM | POA: Diagnosis not present

## 2018-12-07 DIAGNOSIS — R05 Cough: Secondary | ICD-10-CM | POA: Diagnosis not present

## 2018-12-07 DIAGNOSIS — Z681 Body mass index (BMI) 19 or less, adult: Secondary | ICD-10-CM | POA: Diagnosis not present

## 2018-12-12 ENCOUNTER — Telehealth: Payer: Self-pay

## 2018-12-12 NOTE — Telephone Encounter (Signed)
NOTES ON FILE FROM [facility] [phone] , SENT REFERRAL TO SCHEDULING  

## 2018-12-20 DIAGNOSIS — M1991 Primary osteoarthritis, unspecified site: Secondary | ICD-10-CM | POA: Diagnosis not present

## 2018-12-20 DIAGNOSIS — Z681 Body mass index (BMI) 19 or less, adult: Secondary | ICD-10-CM | POA: Diagnosis not present

## 2018-12-20 DIAGNOSIS — Z23 Encounter for immunization: Secondary | ICD-10-CM | POA: Diagnosis not present

## 2018-12-20 DIAGNOSIS — E7800 Pure hypercholesterolemia, unspecified: Secondary | ICD-10-CM | POA: Diagnosis not present

## 2018-12-20 DIAGNOSIS — C9111 Chronic lymphocytic leukemia of B-cell type in remission: Secondary | ICD-10-CM | POA: Diagnosis not present

## 2018-12-20 DIAGNOSIS — Z0000 Encounter for general adult medical examination without abnormal findings: Secondary | ICD-10-CM | POA: Diagnosis not present

## 2018-12-20 DIAGNOSIS — K219 Gastro-esophageal reflux disease without esophagitis: Secondary | ICD-10-CM | POA: Diagnosis not present

## 2019-01-02 DIAGNOSIS — H2513 Age-related nuclear cataract, bilateral: Secondary | ICD-10-CM | POA: Diagnosis not present

## 2019-01-02 DIAGNOSIS — H401122 Primary open-angle glaucoma, left eye, moderate stage: Secondary | ICD-10-CM | POA: Diagnosis not present

## 2019-01-02 DIAGNOSIS — H401113 Primary open-angle glaucoma, right eye, severe stage: Secondary | ICD-10-CM | POA: Diagnosis not present

## 2019-01-02 DIAGNOSIS — H25013 Cortical age-related cataract, bilateral: Secondary | ICD-10-CM | POA: Diagnosis not present

## 2019-01-29 DIAGNOSIS — M8588 Other specified disorders of bone density and structure, other site: Secondary | ICD-10-CM | POA: Diagnosis not present

## 2019-01-29 DIAGNOSIS — M810 Age-related osteoporosis without current pathological fracture: Secondary | ICD-10-CM | POA: Diagnosis not present

## 2019-02-06 ENCOUNTER — Encounter: Payer: Self-pay | Admitting: *Deleted

## 2019-02-07 ENCOUNTER — Ambulatory Visit (INDEPENDENT_AMBULATORY_CARE_PROVIDER_SITE_OTHER): Payer: Medicare Other | Admitting: Cardiology

## 2019-02-07 ENCOUNTER — Other Ambulatory Visit: Payer: Self-pay

## 2019-02-07 ENCOUNTER — Encounter: Payer: Self-pay | Admitting: Cardiology

## 2019-02-07 VITALS — BP 100/62 | HR 74 | Ht 60.0 in | Wt 100.6 lb

## 2019-02-07 DIAGNOSIS — E782 Mixed hyperlipidemia: Secondary | ICD-10-CM | POA: Diagnosis not present

## 2019-02-07 DIAGNOSIS — K219 Gastro-esophageal reflux disease without esophagitis: Secondary | ICD-10-CM

## 2019-02-07 DIAGNOSIS — R002 Palpitations: Secondary | ICD-10-CM | POA: Diagnosis not present

## 2019-02-07 NOTE — Progress Notes (Signed)
Cardiology Office Note  Date: 02/07/2019   ID: Jenna Rasmussen, DOB [birth year], MRN [id number]  PCP:  [facility]  Consulting Cardiologist:  Rozann Lesches, MD Electrophysiologist:  None   Chief Complaint  Patient presents with  . Palpitations    History of Present Illness: Jenna Rasmussen is a 73 y.o. female referred for cardiology consultation by Dr. Quillian Quince for the evaluation of palpitations.  She tells me that she was diagnosed with possible Mnire's disease back in March, has been following a low-sodium diet and has had improvement in vertigo symptoms overall.  During this time she has felt a sense of intermittent "fluttering" in her chest.  Often at nighttime when she is still, but sometimes during the daytime.  She tends to feel better after eating in the evening.  She has never had any associated syncope, no chest pain or breathlessness.  Symptoms have decreased recently but are still occurring at least once or twice every 1 to 2 weeks.  She has no prior history of cardiac arrhythmia.  I reviewed her lab work from August, TSH was normal at that time.  I personally reviewed her ECG today which shows sinus rhythm with low voltage in the limb leads.  Past Medical History:  Diagnosis Date  . Chronic lymphocytic leukemia of B-cell type in remission ([place])   . GERD (gastroesophageal reflux disease)   . Hyperlipidemia   . Osteoarthritis     Past Surgical History:  Procedure Laterality Date  . BACK SURGERY      Current Outpatient Medications  Medication Sig Dispense Refill  . albuterol (PROVENTIL) (2.5 MG/3ML) 0.083% nebulizer solution Take 2.5 mg by nebulization daily.    Marland Kitchen aspirin EC 81 MG tablet Take 81 mg by mouth every other day.     . b complex vitamins capsule Take 1 capsule by mouth daily.    . calcium-vitamin D (CALCIUM 500+D) 500-200 MG-UNIT per tablet Take 1 tablet by mouth daily.      . dorzolamide (TRUSOPT) 2 % ophthalmic solution Place 1 drop into both  eyes 2 (two) times daily.    Marland Kitchen esomeprazole (NEXIUM) 20 MG packet Take 20 mg by mouth daily before breakfast.    . Ginkgo Biloba 120 MG CAPS Take by mouth daily.    Marland Kitchen guaiFENesin (MUCINEX) 600 MG 12 hr tablet Take 600 mg by mouth daily. Over the counter     . latanoprost (XALATAN) 0.005 % ophthalmic solution Place 1 drop into both eyes at bedtime.    Marland Kitchen loratadine (CLARITIN) 10 MG tablet Take 10 mg by mouth as needed for allergies.    Marland Kitchen meclizine (ANTIVERT) 25 MG tablet Take 25 mg by mouth as needed for dizziness.    . pseudoephedrine (SUDAFED) 30 MG tablet Take 30 mg by mouth as needed for congestion.    . Turmeric 500 MG CAPS Take 1 capsule by mouth daily. W/ ginger powder    . Ubiquinol (QUNOL COQ10/UBIQUINOL/MEGA) 100 MG CAPS Take by mouth daily.    Marland Kitchen ZINC-VITAMIN C PO Take by mouth daily.     No current facility-administered medications for this visit.    Allergies:  Rifampin, Codeine, and Flagyl [metronidazole hcl]   Social History: The patient  reports that she quit smoking about 35 years ago. Her smoking use included cigarettes. She has never used smokeless tobacco. She reports that she does not drink alcohol or use drugs.   Family History: The patient's family history includes Heart attack in her father; Heart  disease in her father; Hyperlipidemia in her father.   ROS:  Please see the history of present illness. Otherwise, complete review of systems is positive for none.  All other systems are reviewed and negative.   Physical Exam: VS:  BP 100/62   Pulse 74   Ht 5' (1.524 m)   Wt 100 lb 9.6 oz (45.6 kg)   SpO2 95%   BMI 19.65 kg/m , BMI Body mass index is 19.65 kg/m.  Wt Readings from Last 3 Encounters:  02/07/19 100 lb 9.6 oz (45.6 kg)    General: Elderly woman, appears comfortable at rest. HEENT: Conjunctiva and lids normal, wearing a mask. Neck: Supple, no elevated JVP or carotid bruits, no thyromegaly. Lungs: Clear to auscultation, nonlabored breathing at rest.  Cardiac: Regular rate and rhythm, no S3 or significant systolic murmur, no pericardial rub. Abdomen: Soft, nontender, bowel sounds present. Extremities: No pitting edema, distal pulses 2+. Skin: Warm and dry. Musculoskeletal: No kyphosis. Neuropsychiatric: Alert and oriented x3, affect grossly appropriate.  ECG:  An ECG dated 01/26/2012 was personally reviewed today and demonstrated:  Sinus rhythm with low voltage and decreased R wave progression.  Recent Labwork:  August 2020: BUN 9, creatinine 0.56, potassium 4.3, AST 12, ALT 11, hemoglobin 12.0, platelets 245, cholesterol 203, triglycerides 80, HDL 64, LDL 123, TSH 2.71  Other Studies Reviewed Today:  No prior cardiac testing available for review.  Assessment and Plan:  1.  Intermittent palpitations as outlined above.  Baseline ECG shows sinus bradycardia with normal intervals.  TSH was normal by lab work in August.  She has been following a low-sodium diet, otherwise no major change in routine or medications.  We will obtain a 14-day Zio patch to exclude paroxysmal arrhythmia.  2.  Mixed hyperlipidemia diet.  LDL was 123 back in August.  3.  GERD on Nexium.  Reports no progressive symptoms.  Medication Adjustments/Labs and Tests Ordered: Current medicines are reviewed at length with the patient today.  Concerns regarding medicines are outlined above.   Tests Ordered: Orders Placed This Encounter  Procedures  . LONG TERM MONITOR (3-14 DAYS)  . EKG 12-Lead    Medication Changes: No orders of the defined types were placed in this encounter.   Disposition:  Follow up test results.  Signed, Satira Sark, MD, [facility] 02/07/2019 2:02 PM    [facility] [place], [place], [place] [address] Phone: [phone]; Fax: [phone]

## 2019-02-07 NOTE — Patient Instructions (Addendum)
Medication Instructions:   Your physician recommends that you continue on your current medications as directed. Please refer to the Current Medication list given to you today.  Labwork:  NONE  Testing/Procedures: Your physician has recommended that you wear an event monitor for 14 days. Event monitors are medical devices that record the heart's electrical activity. Doctors most often [place] these monitors to diagnose arrhythmias. Arrhythmias are problems with the speed or rhythm of the heartbeat. The monitor is a small, portable device. You can wear one while you do your normal daily activities. This is usually used to diagnose what is causing palpitations/syncope (passing out). [employer] (ZIO) will contact you about this monitor.  Follow-Up:  Your physician recommends that you schedule a follow-up appointment in: pending  Any Other Special Instructions Will Be Listed Below (If Applicable).  If you need a refill on your cardiac medications before your next appointment, please call your pharmacy.

## 2019-02-08 ENCOUNTER — Telehealth: Payer: Self-pay | Admitting: Cardiology

## 2019-02-08 NOTE — Telephone Encounter (Signed)
Pre-cert Verification for the following procedure   ° ° °14 Day ZIO AT dx: palpitations °

## 2019-02-14 ENCOUNTER — Other Ambulatory Visit (INDEPENDENT_AMBULATORY_CARE_PROVIDER_SITE_OTHER): Payer: Medicare Other

## 2019-02-14 DIAGNOSIS — R002 Palpitations: Secondary | ICD-10-CM

## 2019-02-28 DIAGNOSIS — R42 Dizziness and giddiness: Secondary | ICD-10-CM | POA: Diagnosis not present

## 2019-02-28 DIAGNOSIS — R5383 Other fatigue: Secondary | ICD-10-CM | POA: Diagnosis not present

## 2019-02-28 DIAGNOSIS — Z681 Body mass index (BMI) 19 or less, adult: Secondary | ICD-10-CM | POA: Diagnosis not present

## 2019-02-28 DIAGNOSIS — J309 Allergic rhinitis, unspecified: Secondary | ICD-10-CM | POA: Diagnosis not present

## 2019-03-06 DIAGNOSIS — J019 Acute sinusitis, unspecified: Secondary | ICD-10-CM | POA: Diagnosis not present

## 2019-03-11 ENCOUNTER — Other Ambulatory Visit: Payer: Self-pay

## 2019-03-11 ENCOUNTER — Ambulatory Visit (INDEPENDENT_AMBULATORY_CARE_PROVIDER_SITE_OTHER): Payer: Medicare Other | Admitting: Otolaryngology

## 2019-03-11 DIAGNOSIS — H8101 Meniere's disease, right ear: Secondary | ICD-10-CM

## 2019-03-11 DIAGNOSIS — R42 Dizziness and giddiness: Secondary | ICD-10-CM | POA: Diagnosis not present

## 2019-03-12 ENCOUNTER — Telehealth: Payer: Self-pay | Admitting: *Deleted

## 2019-03-12 NOTE — Telephone Encounter (Signed)
-----   Message from Satira Sark, MD sent at 03/12/2019 12:02 PM EST ----- Results reviewed.  Heart rhythm is normal.  She does have a rare PACs and PVCs as well as a few brief episodes of SVT which she is likely experiencing as palpitations.  Please let her know that these are not dangerous and she does not require any specific medications at this time.  Would recommend exercise, limiting caffeine and other stimulants such as Sudafed.

## 2019-03-12 NOTE — Telephone Encounter (Signed)
Patient informed. Copy sent to PCP °

## 2019-03-19 DIAGNOSIS — R42 Dizziness and giddiness: Secondary | ICD-10-CM | POA: Diagnosis not present

## 2019-03-19 DIAGNOSIS — H832X1 Labyrinthine dysfunction, right ear: Secondary | ICD-10-CM | POA: Diagnosis not present

## 2019-03-25 DIAGNOSIS — R42 Dizziness and giddiness: Secondary | ICD-10-CM | POA: Diagnosis not present

## 2019-05-10 DIAGNOSIS — E7801 Familial hypercholesterolemia: Secondary | ICD-10-CM | POA: Diagnosis not present

## 2019-05-10 DIAGNOSIS — K219 Gastro-esophageal reflux disease without esophagitis: Secondary | ICD-10-CM | POA: Diagnosis not present

## 2019-06-14 DIAGNOSIS — M19072 Primary osteoarthritis, left ankle and foot: Secondary | ICD-10-CM | POA: Diagnosis not present

## 2019-06-14 DIAGNOSIS — K219 Gastro-esophageal reflux disease without esophagitis: Secondary | ICD-10-CM | POA: Diagnosis not present

## 2019-06-14 DIAGNOSIS — E7801 Familial hypercholesterolemia: Secondary | ICD-10-CM | POA: Diagnosis not present

## 2019-06-14 DIAGNOSIS — R739 Hyperglycemia, unspecified: Secondary | ICD-10-CM | POA: Diagnosis not present

## 2019-06-14 DIAGNOSIS — E7800 Pure hypercholesterolemia, unspecified: Secondary | ICD-10-CM | POA: Diagnosis not present

## 2019-06-14 DIAGNOSIS — R5383 Other fatigue: Secondary | ICD-10-CM | POA: Diagnosis not present

## 2019-06-14 DIAGNOSIS — C9111 Chronic lymphocytic leukemia of B-cell type in remission: Secondary | ICD-10-CM | POA: Diagnosis not present

## 2019-06-17 DIAGNOSIS — K219 Gastro-esophageal reflux disease without esophagitis: Secondary | ICD-10-CM | POA: Diagnosis not present

## 2019-06-17 DIAGNOSIS — C9111 Chronic lymphocytic leukemia of B-cell type in remission: Secondary | ICD-10-CM | POA: Diagnosis not present

## 2019-06-17 DIAGNOSIS — E7800 Pure hypercholesterolemia, unspecified: Secondary | ICD-10-CM | POA: Diagnosis not present

## 2019-06-17 DIAGNOSIS — Z681 Body mass index (BMI) 19 or less, adult: Secondary | ICD-10-CM | POA: Diagnosis not present

## 2019-06-17 DIAGNOSIS — M1991 Primary osteoarthritis, unspecified site: Secondary | ICD-10-CM | POA: Diagnosis not present

## 2019-07-10 DIAGNOSIS — I10 Essential (primary) hypertension: Secondary | ICD-10-CM | POA: Diagnosis not present

## 2019-07-10 DIAGNOSIS — E7849 Other hyperlipidemia: Secondary | ICD-10-CM | POA: Diagnosis not present

## 2019-07-29 DIAGNOSIS — H401113 Primary open-angle glaucoma, right eye, severe stage: Secondary | ICD-10-CM | POA: Diagnosis not present

## 2019-07-29 DIAGNOSIS — H401122 Primary open-angle glaucoma, left eye, moderate stage: Secondary | ICD-10-CM | POA: Diagnosis not present

## 2019-08-09 DIAGNOSIS — K219 Gastro-esophageal reflux disease without esophagitis: Secondary | ICD-10-CM | POA: Diagnosis not present

## 2019-08-09 DIAGNOSIS — E7801 Familial hypercholesterolemia: Secondary | ICD-10-CM | POA: Diagnosis not present

## 2019-09-09 DIAGNOSIS — K219 Gastro-esophageal reflux disease without esophagitis: Secondary | ICD-10-CM | POA: Diagnosis not present

## 2019-09-09 DIAGNOSIS — C9111 Chronic lymphocytic leukemia of B-cell type in remission: Secondary | ICD-10-CM | POA: Diagnosis not present

## 2019-09-09 DIAGNOSIS — J471 Bronchiectasis with (acute) exacerbation: Secondary | ICD-10-CM | POA: Diagnosis not present

## 2019-11-08 DIAGNOSIS — C9111 Chronic lymphocytic leukemia of B-cell type in remission: Secondary | ICD-10-CM | POA: Diagnosis not present

## 2019-11-08 DIAGNOSIS — J471 Bronchiectasis with (acute) exacerbation: Secondary | ICD-10-CM | POA: Diagnosis not present

## 2019-11-08 DIAGNOSIS — K219 Gastro-esophageal reflux disease without esophagitis: Secondary | ICD-10-CM | POA: Diagnosis not present

## 2019-12-10 DIAGNOSIS — K219 Gastro-esophageal reflux disease without esophagitis: Secondary | ICD-10-CM | POA: Diagnosis not present

## 2019-12-10 DIAGNOSIS — J471 Bronchiectasis with (acute) exacerbation: Secondary | ICD-10-CM | POA: Diagnosis not present

## 2019-12-10 DIAGNOSIS — C9111 Chronic lymphocytic leukemia of B-cell type in remission: Secondary | ICD-10-CM | POA: Diagnosis not present

## 2019-12-11 DIAGNOSIS — Z0000 Encounter for general adult medical examination without abnormal findings: Secondary | ICD-10-CM | POA: Diagnosis not present

## 2019-12-11 DIAGNOSIS — E7800 Pure hypercholesterolemia, unspecified: Secondary | ICD-10-CM | POA: Diagnosis not present

## 2019-12-11 DIAGNOSIS — C9111 Chronic lymphocytic leukemia of B-cell type in remission: Secondary | ICD-10-CM | POA: Diagnosis not present

## 2019-12-11 DIAGNOSIS — K219 Gastro-esophageal reflux disease without esophagitis: Secondary | ICD-10-CM | POA: Diagnosis not present

## 2019-12-11 DIAGNOSIS — E7801 Familial hypercholesterolemia: Secondary | ICD-10-CM | POA: Diagnosis not present

## 2019-12-17 DIAGNOSIS — H2513 Age-related nuclear cataract, bilateral: Secondary | ICD-10-CM | POA: Diagnosis not present

## 2019-12-17 DIAGNOSIS — H401122 Primary open-angle glaucoma, left eye, moderate stage: Secondary | ICD-10-CM | POA: Diagnosis not present

## 2019-12-17 DIAGNOSIS — M1991 Primary osteoarthritis, unspecified site: Secondary | ICD-10-CM | POA: Diagnosis not present

## 2019-12-17 DIAGNOSIS — Z681 Body mass index (BMI) 19 or less, adult: Secondary | ICD-10-CM | POA: Diagnosis not present

## 2019-12-17 DIAGNOSIS — D489 Neoplasm of uncertain behavior, unspecified: Secondary | ICD-10-CM | POA: Diagnosis not present

## 2019-12-17 DIAGNOSIS — E7800 Pure hypercholesterolemia, unspecified: Secondary | ICD-10-CM | POA: Diagnosis not present

## 2019-12-17 DIAGNOSIS — H401113 Primary open-angle glaucoma, right eye, severe stage: Secondary | ICD-10-CM | POA: Diagnosis not present

## 2019-12-17 DIAGNOSIS — H35363 Drusen (degenerative) of macula, bilateral: Secondary | ICD-10-CM | POA: Diagnosis not present

## 2019-12-17 DIAGNOSIS — C9111 Chronic lymphocytic leukemia of B-cell type in remission: Secondary | ICD-10-CM | POA: Diagnosis not present

## 2019-12-19 DIAGNOSIS — L570 Actinic keratosis: Secondary | ICD-10-CM | POA: Diagnosis not present

## 2019-12-19 DIAGNOSIS — X32XXXA Exposure to sunlight, initial encounter: Secondary | ICD-10-CM | POA: Diagnosis not present

## 2019-12-19 DIAGNOSIS — C44511 Basal cell carcinoma of skin of breast: Secondary | ICD-10-CM | POA: Diagnosis not present

## 2020-01-09 DIAGNOSIS — K219 Gastro-esophageal reflux disease without esophagitis: Secondary | ICD-10-CM | POA: Diagnosis not present

## 2020-01-09 DIAGNOSIS — J471 Bronchiectasis with (acute) exacerbation: Secondary | ICD-10-CM | POA: Diagnosis not present

## 2020-01-09 DIAGNOSIS — C9111 Chronic lymphocytic leukemia of B-cell type in remission: Secondary | ICD-10-CM | POA: Diagnosis not present

## 2020-01-15 DIAGNOSIS — H52213 Irregular astigmatism, bilateral: Secondary | ICD-10-CM | POA: Diagnosis not present

## 2020-01-15 DIAGNOSIS — H2512 Age-related nuclear cataract, left eye: Secondary | ICD-10-CM | POA: Diagnosis not present

## 2020-01-15 DIAGNOSIS — H25013 Cortical age-related cataract, bilateral: Secondary | ICD-10-CM | POA: Diagnosis not present

## 2020-01-15 DIAGNOSIS — H401122 Primary open-angle glaucoma, left eye, moderate stage: Secondary | ICD-10-CM | POA: Diagnosis not present

## 2020-01-15 DIAGNOSIS — H401113 Primary open-angle glaucoma, right eye, severe stage: Secondary | ICD-10-CM | POA: Diagnosis not present

## 2020-01-15 DIAGNOSIS — H2513 Age-related nuclear cataract, bilateral: Secondary | ICD-10-CM | POA: Diagnosis not present

## 2020-01-17 DIAGNOSIS — Z23 Encounter for immunization: Secondary | ICD-10-CM | POA: Diagnosis not present

## 2020-01-21 DIAGNOSIS — Z85828 Personal history of other malignant neoplasm of skin: Secondary | ICD-10-CM | POA: Diagnosis not present

## 2020-01-21 DIAGNOSIS — Z08 Encounter for follow-up examination after completed treatment for malignant neoplasm: Secondary | ICD-10-CM | POA: Diagnosis not present

## 2020-01-25 DIAGNOSIS — J471 Bronchiectasis with (acute) exacerbation: Secondary | ICD-10-CM | POA: Diagnosis not present

## 2020-01-25 DIAGNOSIS — Z681 Body mass index (BMI) 19 or less, adult: Secondary | ICD-10-CM | POA: Diagnosis not present

## 2020-01-25 DIAGNOSIS — F4329 Adjustment disorder with other symptoms: Secondary | ICD-10-CM | POA: Diagnosis not present

## 2020-02-18 DIAGNOSIS — H25812 Combined forms of age-related cataract, left eye: Secondary | ICD-10-CM | POA: Diagnosis not present

## 2020-02-18 DIAGNOSIS — H2512 Age-related nuclear cataract, left eye: Secondary | ICD-10-CM | POA: Diagnosis not present

## 2020-02-25 DIAGNOSIS — Z23 Encounter for immunization: Secondary | ICD-10-CM | POA: Diagnosis not present

## 2020-02-25 DIAGNOSIS — F4329 Adjustment disorder with other symptoms: Secondary | ICD-10-CM | POA: Diagnosis not present

## 2020-02-25 DIAGNOSIS — H9319 Tinnitus, unspecified ear: Secondary | ICD-10-CM | POA: Diagnosis not present

## 2020-02-25 DIAGNOSIS — Z681 Body mass index (BMI) 19 or less, adult: Secondary | ICD-10-CM | POA: Diagnosis not present

## 2020-02-25 DIAGNOSIS — R5383 Other fatigue: Secondary | ICD-10-CM | POA: Diagnosis not present

## 2020-03-10 DIAGNOSIS — J471 Bronchiectasis with (acute) exacerbation: Secondary | ICD-10-CM | POA: Diagnosis not present

## 2020-03-10 DIAGNOSIS — K219 Gastro-esophageal reflux disease without esophagitis: Secondary | ICD-10-CM | POA: Diagnosis not present

## 2020-03-10 DIAGNOSIS — C9111 Chronic lymphocytic leukemia of B-cell type in remission: Secondary | ICD-10-CM | POA: Diagnosis not present

## 2020-03-18 DIAGNOSIS — H25011 Cortical age-related cataract, right eye: Secondary | ICD-10-CM | POA: Diagnosis not present

## 2020-03-18 DIAGNOSIS — H2511 Age-related nuclear cataract, right eye: Secondary | ICD-10-CM | POA: Diagnosis not present

## 2020-03-24 DIAGNOSIS — H25011 Cortical age-related cataract, right eye: Secondary | ICD-10-CM | POA: Diagnosis not present

## 2020-03-24 DIAGNOSIS — H25811 Combined forms of age-related cataract, right eye: Secondary | ICD-10-CM | POA: Diagnosis not present

## 2020-03-24 DIAGNOSIS — H2511 Age-related nuclear cataract, right eye: Secondary | ICD-10-CM | POA: Diagnosis not present

## 2020-04-07 DIAGNOSIS — R059 Cough, unspecified: Secondary | ICD-10-CM | POA: Diagnosis not present

## 2020-04-07 DIAGNOSIS — J019 Acute sinusitis, unspecified: Secondary | ICD-10-CM | POA: Diagnosis not present

## 2020-04-07 DIAGNOSIS — J329 Chronic sinusitis, unspecified: Secondary | ICD-10-CM | POA: Diagnosis not present

## 2020-04-07 DIAGNOSIS — Z20828 Contact with and (suspected) exposure to other viral communicable diseases: Secondary | ICD-10-CM | POA: Diagnosis not present

## 2020-04-10 DIAGNOSIS — J471 Bronchiectasis with (acute) exacerbation: Secondary | ICD-10-CM | POA: Diagnosis not present

## 2020-04-10 DIAGNOSIS — C9111 Chronic lymphocytic leukemia of B-cell type in remission: Secondary | ICD-10-CM | POA: Diagnosis not present

## 2020-04-10 DIAGNOSIS — K219 Gastro-esophageal reflux disease without esophagitis: Secondary | ICD-10-CM | POA: Diagnosis not present

## 2020-04-22 ENCOUNTER — Other Ambulatory Visit (HOSPITAL_COMMUNITY): Payer: Self-pay | Admitting: Otolaryngology

## 2020-04-22 ENCOUNTER — Other Ambulatory Visit: Payer: Self-pay | Admitting: Otolaryngology

## 2020-04-22 DIAGNOSIS — H838X3 Other specified diseases of inner ear, bilateral: Secondary | ICD-10-CM | POA: Diagnosis not present

## 2020-04-22 DIAGNOSIS — H903 Sensorineural hearing loss, bilateral: Secondary | ICD-10-CM | POA: Diagnosis not present

## 2020-04-22 DIAGNOSIS — H6123 Impacted cerumen, bilateral: Secondary | ICD-10-CM | POA: Diagnosis not present

## 2020-04-22 DIAGNOSIS — H8101 Meniere's disease, right ear: Secondary | ICD-10-CM | POA: Diagnosis not present

## 2020-04-22 DIAGNOSIS — H9311 Tinnitus, right ear: Secondary | ICD-10-CM | POA: Diagnosis not present

## 2020-04-24 DIAGNOSIS — Z6820 Body mass index (BMI) 20.0-20.9, adult: Secondary | ICD-10-CM | POA: Diagnosis not present

## 2020-04-24 DIAGNOSIS — R51 Headache with orthostatic component, not elsewhere classified: Secondary | ICD-10-CM | POA: Diagnosis not present

## 2020-04-24 DIAGNOSIS — H9319 Tinnitus, unspecified ear: Secondary | ICD-10-CM | POA: Diagnosis not present

## 2020-05-05 ENCOUNTER — Ambulatory Visit (HOSPITAL_COMMUNITY)
Admission: RE | Admit: 2020-05-05 | Discharge: 2020-05-05 | Disposition: A | Discharge status: Home / Self Care | Payer: Medicare Other | Source: Ambulatory Visit | Attending: Otolaryngology | Admitting: Otolaryngology

## 2020-05-05 DIAGNOSIS — G9389 Other specified disorders of brain: Secondary | ICD-10-CM | POA: Diagnosis not present

## 2020-05-05 DIAGNOSIS — R42 Dizziness and giddiness: Secondary | ICD-10-CM | POA: Diagnosis not present

## 2020-05-05 DIAGNOSIS — J320 Chronic maxillary sinusitis: Secondary | ICD-10-CM | POA: Diagnosis not present

## 2020-05-05 DIAGNOSIS — H903 Sensorineural hearing loss, bilateral: Secondary | ICD-10-CM | POA: Diagnosis not present

## 2020-05-05 DIAGNOSIS — G319 Degenerative disease of nervous system, unspecified: Secondary | ICD-10-CM | POA: Diagnosis not present

## 2020-05-05 MED ORDER — GADOBUTROL 1 MMOL/ML IV SOLN
7.0000 mL | Freq: Once | INTRAVENOUS | Status: AC | PRN
  Administered 2020-05-05: 7 mL via INTRAVENOUS

## 2020-05-09 DIAGNOSIS — C9111 Chronic lymphocytic leukemia of B-cell type in remission: Secondary | ICD-10-CM | POA: Diagnosis not present

## 2020-05-09 DIAGNOSIS — J471 Bronchiectasis with (acute) exacerbation: Secondary | ICD-10-CM | POA: Diagnosis not present

## 2020-05-09 DIAGNOSIS — K219 Gastro-esophageal reflux disease without esophagitis: Secondary | ICD-10-CM | POA: Diagnosis not present

## 2020-06-01 DIAGNOSIS — H401113 Primary open-angle glaucoma, right eye, severe stage: Secondary | ICD-10-CM | POA: Diagnosis not present

## 2020-06-01 DIAGNOSIS — H401122 Primary open-angle glaucoma, left eye, moderate stage: Secondary | ICD-10-CM | POA: Diagnosis not present

## 2020-06-01 DIAGNOSIS — H53423 Scotoma of blind spot area, bilateral: Secondary | ICD-10-CM | POA: Diagnosis not present

## 2020-06-08 DIAGNOSIS — K219 Gastro-esophageal reflux disease without esophagitis: Secondary | ICD-10-CM | POA: Diagnosis not present

## 2020-06-08 DIAGNOSIS — J471 Bronchiectasis with (acute) exacerbation: Secondary | ICD-10-CM | POA: Diagnosis not present

## 2020-06-08 DIAGNOSIS — C9111 Chronic lymphocytic leukemia of B-cell type in remission: Secondary | ICD-10-CM | POA: Diagnosis not present

## 2020-06-09 DIAGNOSIS — E7800 Pure hypercholesterolemia, unspecified: Secondary | ICD-10-CM | POA: Diagnosis not present

## 2020-06-09 DIAGNOSIS — K219 Gastro-esophageal reflux disease without esophagitis: Secondary | ICD-10-CM | POA: Diagnosis not present

## 2020-06-09 DIAGNOSIS — E7801 Familial hypercholesterolemia: Secondary | ICD-10-CM | POA: Diagnosis not present

## 2020-06-16 DIAGNOSIS — Z6821 Body mass index (BMI) 21.0-21.9, adult: Secondary | ICD-10-CM | POA: Diagnosis not present

## 2020-06-16 DIAGNOSIS — M810 Age-related osteoporosis without current pathological fracture: Secondary | ICD-10-CM | POA: Diagnosis not present

## 2020-06-16 DIAGNOSIS — C9110 Chronic lymphocytic leukemia of B-cell type not having achieved remission: Secondary | ICD-10-CM | POA: Diagnosis not present

## 2020-06-16 DIAGNOSIS — Z0001 Encounter for general adult medical examination with abnormal findings: Secondary | ICD-10-CM | POA: Diagnosis not present

## 2020-06-16 DIAGNOSIS — C9510 Chronic leukemia of unspecified cell type not having achieved remission: Secondary | ICD-10-CM | POA: Diagnosis not present

## 2020-07-08 DIAGNOSIS — K219 Gastro-esophageal reflux disease without esophagitis: Secondary | ICD-10-CM | POA: Diagnosis not present

## 2020-07-08 DIAGNOSIS — C9111 Chronic lymphocytic leukemia of B-cell type in remission: Secondary | ICD-10-CM | POA: Diagnosis not present

## 2020-07-08 DIAGNOSIS — J471 Bronchiectasis with (acute) exacerbation: Secondary | ICD-10-CM | POA: Diagnosis not present

## 2020-07-21 DIAGNOSIS — L821 Other seborrheic keratosis: Secondary | ICD-10-CM | POA: Diagnosis not present

## 2020-07-21 DIAGNOSIS — Z85828 Personal history of other malignant neoplasm of skin: Secondary | ICD-10-CM | POA: Diagnosis not present

## 2020-07-21 DIAGNOSIS — Z08 Encounter for follow-up examination after completed treatment for malignant neoplasm: Secondary | ICD-10-CM | POA: Diagnosis not present

## 2020-07-28 DIAGNOSIS — Z23 Encounter for immunization: Secondary | ICD-10-CM | POA: Diagnosis not present

## 2020-07-29 DIAGNOSIS — H401122 Primary open-angle glaucoma, left eye, moderate stage: Secondary | ICD-10-CM | POA: Diagnosis not present

## 2020-07-29 DIAGNOSIS — H401113 Primary open-angle glaucoma, right eye, severe stage: Secondary | ICD-10-CM | POA: Diagnosis not present

## 2020-08-08 DIAGNOSIS — C9111 Chronic lymphocytic leukemia of B-cell type in remission: Secondary | ICD-10-CM | POA: Diagnosis not present

## 2020-08-08 DIAGNOSIS — J471 Bronchiectasis with (acute) exacerbation: Secondary | ICD-10-CM | POA: Diagnosis not present

## 2020-08-08 DIAGNOSIS — K219 Gastro-esophageal reflux disease without esophagitis: Secondary | ICD-10-CM | POA: Diagnosis not present

## 2020-09-07 DIAGNOSIS — J471 Bronchiectasis with (acute) exacerbation: Secondary | ICD-10-CM | POA: Diagnosis not present

## 2020-09-07 DIAGNOSIS — K219 Gastro-esophageal reflux disease without esophagitis: Secondary | ICD-10-CM | POA: Diagnosis not present

## 2020-09-07 DIAGNOSIS — C9111 Chronic lymphocytic leukemia of B-cell type in remission: Secondary | ICD-10-CM | POA: Diagnosis not present

## 2020-10-02 DIAGNOSIS — Z1231 Encounter for screening mammogram for malignant neoplasm of breast: Secondary | ICD-10-CM | POA: Diagnosis not present

## 2020-10-08 DIAGNOSIS — J471 Bronchiectasis with (acute) exacerbation: Secondary | ICD-10-CM | POA: Diagnosis not present

## 2020-10-08 DIAGNOSIS — K219 Gastro-esophageal reflux disease without esophagitis: Secondary | ICD-10-CM | POA: Diagnosis not present

## 2020-10-08 DIAGNOSIS — C9111 Chronic lymphocytic leukemia of B-cell type in remission: Secondary | ICD-10-CM | POA: Diagnosis not present

## 2020-10-27 ENCOUNTER — Other Ambulatory Visit (HOSPITAL_COMMUNITY): Payer: Self-pay | Admitting: Otolaryngology

## 2020-10-27 DIAGNOSIS — R42 Dizziness and giddiness: Secondary | ICD-10-CM | POA: Diagnosis not present

## 2020-10-27 DIAGNOSIS — H9190 Unspecified hearing loss, unspecified ear: Secondary | ICD-10-CM

## 2020-10-27 DIAGNOSIS — H8101 Meniere's disease, right ear: Secondary | ICD-10-CM | POA: Diagnosis not present

## 2020-10-27 DIAGNOSIS — H9041 Sensorineural hearing loss, unilateral, right ear, with unrestricted hearing on the contralateral side: Secondary | ICD-10-CM | POA: Diagnosis not present

## 2020-10-30 ENCOUNTER — Other Ambulatory Visit: Payer: Self-pay | Admitting: Otolaryngology

## 2020-11-06 ENCOUNTER — Encounter (HOSPITAL_BASED_OUTPATIENT_CLINIC_OR_DEPARTMENT_OTHER): Payer: Self-pay | Admitting: Emergency Medicine

## 2020-11-06 ENCOUNTER — Other Ambulatory Visit: Payer: Self-pay

## 2020-11-08 DIAGNOSIS — J471 Bronchiectasis with (acute) exacerbation: Secondary | ICD-10-CM | POA: Diagnosis not present

## 2020-11-08 DIAGNOSIS — K219 Gastro-esophageal reflux disease without esophagitis: Secondary | ICD-10-CM | POA: Diagnosis not present

## 2020-11-08 DIAGNOSIS — C9111 Chronic lymphocytic leukemia of B-cell type in remission: Secondary | ICD-10-CM | POA: Diagnosis not present

## 2020-11-11 ENCOUNTER — Other Ambulatory Visit: Payer: Self-pay

## 2020-11-11 ENCOUNTER — Encounter (HOSPITAL_COMMUNITY)
Admission: RE | Admit: 2020-11-11 | Discharge: 2020-11-11 | Disposition: A | Discharge status: Home / Self Care | Payer: Medicare Other | Source: Ambulatory Visit | Attending: Otolaryngology | Admitting: Otolaryngology

## 2020-11-11 DIAGNOSIS — Z01812 Encounter for preprocedural laboratory examination: Secondary | ICD-10-CM | POA: Insufficient documentation

## 2020-11-11 LAB — BASIC METABOLIC PANEL
Anion gap: 9 (ref 5–15)
BUN: 15 mg/dL (ref 8–23)
CO2: 26 mmol/L (ref 22–32)
Calcium: 8.8 mg/dL — ABNORMAL LOW (ref 8.9–10.3)
Chloride: 97 mmol/L — ABNORMAL LOW (ref 98–111)
Creatinine, Ser: 0.69 mg/dL (ref 0.44–1.00)
GFR, Estimated: >60 mL/min (ref >60)
Glucose, Bld: 65 mg/dL — ABNORMAL LOW (ref 70–99)
Potassium: 3.5 mmol/L (ref 3.5–5.1)
Sodium: 132 mmol/L — ABNORMAL LOW (ref 135–145)

## 2020-11-12 ENCOUNTER — Ambulatory Visit (HOSPITAL_COMMUNITY)
Admission: RE | Admit: 2020-11-12 | Discharge: 2020-11-12 | Disposition: A | Discharge status: Home / Self Care | Payer: Medicare Other | Source: Ambulatory Visit | Attending: Otolaryngology | Admitting: Otolaryngology

## 2020-11-12 DIAGNOSIS — J3489 Other specified disorders of nose and nasal sinuses: Secondary | ICD-10-CM | POA: Diagnosis not present

## 2020-11-12 DIAGNOSIS — J320 Chronic maxillary sinusitis: Secondary | ICD-10-CM | POA: Diagnosis not present

## 2020-11-12 DIAGNOSIS — H9190 Unspecified hearing loss, unspecified ear: Secondary | ICD-10-CM | POA: Insufficient documentation

## 2020-11-16 ENCOUNTER — Ambulatory Visit (HOSPITAL_BASED_OUTPATIENT_CLINIC_OR_DEPARTMENT_OTHER)
Admission: RE | Admit: 2020-11-16 | Discharge: 2020-11-16 | Disposition: A | Discharge status: Home / Self Care | Payer: Medicare Other | Attending: Otolaryngology | Admitting: Otolaryngology

## 2020-11-16 ENCOUNTER — Other Ambulatory Visit: Payer: Self-pay

## 2020-11-16 ENCOUNTER — Ambulatory Visit (HOSPITAL_BASED_OUTPATIENT_CLINIC_OR_DEPARTMENT_OTHER): Payer: Medicare Other | Admitting: Anesthesiology

## 2020-11-16 ENCOUNTER — Encounter (HOSPITAL_BASED_OUTPATIENT_CLINIC_OR_DEPARTMENT_OTHER)
Admission: RE | Disposition: A | Discharge status: Home / Self Care | Payer: Self-pay | Source: Home / Self Care | Attending: Otolaryngology

## 2020-11-16 ENCOUNTER — Encounter (HOSPITAL_BASED_OUTPATIENT_CLINIC_OR_DEPARTMENT_OTHER): Payer: Self-pay | Admitting: Otolaryngology

## 2020-11-16 DIAGNOSIS — R42 Dizziness and giddiness: Secondary | ICD-10-CM | POA: Diagnosis not present

## 2020-11-16 DIAGNOSIS — K219 Gastro-esophageal reflux disease without esophagitis: Secondary | ICD-10-CM | POA: Diagnosis not present

## 2020-11-16 DIAGNOSIS — M1990 Unspecified osteoarthritis, unspecified site: Secondary | ICD-10-CM | POA: Diagnosis not present

## 2020-11-16 DIAGNOSIS — H8101 Meniere's disease, right ear: Secondary | ICD-10-CM | POA: Diagnosis not present

## 2020-11-16 DIAGNOSIS — Z87891 Personal history of nicotine dependence: Secondary | ICD-10-CM | POA: Insufficient documentation

## 2020-11-16 DIAGNOSIS — E785 Hyperlipidemia, unspecified: Secondary | ICD-10-CM | POA: Diagnosis not present

## 2020-11-16 HISTORY — PX: ENDOLYMPHATIC SAC DECOMPRESSION: SHX6529

## 2020-11-16 SURGERY — DECOMPRESSION, ENDOLYMPHATIC SAC
Anesthesia: General | Site: Ear | Laterality: Right

## 2020-11-16 MED ORDER — FENTANYL CITRATE (PF) 100 MCG/2ML IJ SOLN
INTRAMUSCULAR | Status: DC | PRN
  Administered 2020-11-16: 50 ug via INTRAVENOUS
  Administered 2020-11-16 (×2): 25 ug via INTRAVENOUS

## 2020-11-16 MED ORDER — OXYCODONE-ACETAMINOPHEN 5-325 MG PO TABS: 1.0000 | ORAL_TABLET | Freq: Four times a day (QID) | ORAL | 0 refills | Status: AC | PRN

## 2020-11-16 MED ORDER — MIDAZOLAM HCL 2 MG/2ML IJ SOLN
INTRAMUSCULAR | Status: AC
  Filled 2020-11-16: qty 2

## 2020-11-16 MED ORDER — DEXAMETHASONE SODIUM PHOSPHATE 4 MG/ML IJ SOLN
INTRAMUSCULAR | Status: DC | PRN
  Administered 2020-11-16: 5 mg via INTRAVENOUS

## 2020-11-16 MED ORDER — LACTATED RINGERS IV SOLN: INTRAVENOUS | Status: DC

## 2020-11-16 MED ORDER — PROPOFOL 10 MG/ML IV BOLUS
INTRAVENOUS | Status: DC | PRN
  Administered 2020-11-16: 150 mg via INTRAVENOUS

## 2020-11-16 MED ORDER — HYDROMORPHONE HCL 1 MG/ML IJ SOLN
INTRAMUSCULAR | Status: AC
  Filled 2020-11-16: qty 0.5

## 2020-11-16 MED ORDER — EPINEPHRINE PF 1 MG/ML IJ SOLN
INTRAMUSCULAR | Status: DC | PRN
  Administered 2020-11-16: .3 mg

## 2020-11-16 MED ORDER — CEFAZOLIN SODIUM-DEXTROSE 2-3 GM-%(50ML) IV SOLR
INTRAVENOUS | Status: DC | PRN
  Administered 2020-11-16: 2 g via INTRAVENOUS

## 2020-11-16 MED ORDER — LIDOCAINE 2% (20 MG/ML) 5 ML SYRINGE
INTRAMUSCULAR | Status: DC | PRN
  Administered 2020-11-16: 40 mg via INTRAVENOUS

## 2020-11-16 MED ORDER — PROMETHAZINE HCL 25 MG/ML IJ SOLN: 6.2500 mg | INTRAMUSCULAR | Status: DC | PRN

## 2020-11-16 MED ORDER — LIDOCAINE-EPINEPHRINE 1 %-1:100000 IJ SOLN
INTRAMUSCULAR | Status: AC
  Filled 2020-11-16: qty 1

## 2020-11-16 MED ORDER — CIPROFLOXACIN-DEXAMETHASONE 0.3-0.1 % OT SUSP
OTIC | Status: DC | PRN
  Administered 2020-11-16: 10 [drp] via OTIC

## 2020-11-16 MED ORDER — AMISULPRIDE (ANTIEMETIC) 5 MG/2ML IV SOLN
10.0000 mg | Freq: Once | INTRAVENOUS | Status: AC | PRN
  Administered 2020-11-16: 10 mg via INTRAVENOUS

## 2020-11-16 MED ORDER — FENTANYL CITRATE (PF) 100 MCG/2ML IJ SOLN
INTRAMUSCULAR | Status: AC
  Filled 2020-11-16: qty 2

## 2020-11-16 MED ORDER — LIDOCAINE-EPINEPHRINE 1 %-1:100000 IJ SOLN
INTRAMUSCULAR | Status: DC | PRN
  Administered 2020-11-16: 3 mL

## 2020-11-16 MED ORDER — DEXAMETHASONE SODIUM PHOSPHATE 10 MG/ML IJ SOLN
INTRAMUSCULAR | Status: AC
  Filled 2020-11-16: qty 1

## 2020-11-16 MED ORDER — OXYCODONE HCL 5 MG PO TABS: 5.0000 mg | ORAL_TABLET | Freq: Once | ORAL | Status: DC | PRN

## 2020-11-16 MED ORDER — LIDOCAINE HCL (CARDIAC) PF 100 MG/5ML IV SOSY: PREFILLED_SYRINGE | INTRAVENOUS | Status: DC | PRN

## 2020-11-16 MED ORDER — ONDANSETRON HCL 4 MG/2ML IJ SOLN
INTRAMUSCULAR | Status: AC
  Filled 2020-11-16: qty 2

## 2020-11-16 MED ORDER — OXYCODONE HCL 5 MG/5ML PO SOLN: 5.0000 mg | Freq: Once | ORAL | Status: DC | PRN

## 2020-11-16 MED ORDER — BACITRACIN ZINC 500 UNIT/GM EX OINT
TOPICAL_OINTMENT | CUTANEOUS | Status: AC
  Filled 2020-11-16: qty 28.35

## 2020-11-16 MED ORDER — MIDAZOLAM HCL 5 MG/5ML IJ SOLN
INTRAMUSCULAR | Status: DC | PRN
  Administered 2020-11-16: 1 mg via INTRAVENOUS

## 2020-11-16 MED ORDER — ONDANSETRON HCL 4 MG/2ML IJ SOLN
INTRAMUSCULAR | Status: DC | PRN
  Administered 2020-11-16: 4 mg via INTRAVENOUS

## 2020-11-16 MED ORDER — AMISULPRIDE (ANTIEMETIC) 5 MG/2ML IV SOLN
INTRAVENOUS | Status: AC
  Filled 2020-11-16: qty 4

## 2020-11-16 MED ORDER — CIPROFLOXACIN-HYDROCORTISONE 0.2-1 % OT SUSP
OTIC | Status: AC
  Filled 2020-11-16: qty 10

## 2020-11-16 MED ORDER — CIPROFLOXACIN-DEXAMETHASONE 0.3-0.1 % OT SUSP
OTIC | Status: AC
  Filled 2020-11-16: qty 7.5

## 2020-11-16 MED ORDER — LIDOCAINE HCL (PF) 2 % IJ SOLN
INTRAMUSCULAR | Status: AC
  Filled 2020-11-16: qty 5

## 2020-11-16 MED ORDER — PROPOFOL 10 MG/ML IV BOLUS
INTRAVENOUS | Status: AC
  Filled 2020-11-16: qty 20

## 2020-11-16 MED ORDER — HYDROMORPHONE HCL 1 MG/ML IJ SOLN
0.2500 mg | INTRAMUSCULAR | Status: DC | PRN
  Administered 2020-11-16: 0.5 mg via INTRAVENOUS

## 2020-11-16 SURGICAL SUPPLY — 55 items
ADH SKN CLS APL DERMABOND .7 (GAUZE/BANDAGES/DRESSINGS) ×1
BALL CTTN LRG ABS STRL LF (GAUZE/BANDAGES/DRESSINGS) ×1
BLADE CLIPPER SURG (BLADE) ×1 IMPLANT
BUR DIAMOND COARSE 3.0 (BURR) ×2 IMPLANT
BUR RND OSTEON ELITE 6.0 (BURR) ×1 IMPLANT
CANISTER SUCT 1200ML W/VALVE (MISCELLANEOUS) ×2 IMPLANT
CORD BIPOLAR FORCEPS 12FT (ELECTRODE) ×2 IMPLANT
COTTONBALL LRG STERILE PKG (GAUZE/BANDAGES/DRESSINGS) ×2 IMPLANT
DERMABOND ADVANCED (GAUZE/BANDAGES/DRESSINGS) ×1
DERMABOND ADVANCED .7 DNX12 (GAUZE/BANDAGES/DRESSINGS) ×1 IMPLANT
DRAPE MICROSCOPE WILD 40.5X102 (DRAPES) ×2 IMPLANT
DRAPE SURG 17X23 STRL (DRAPES) ×2 IMPLANT
DRAPE SURG IRRIG POUCH 19X23 (DRAPES) ×2 IMPLANT
DRSG GLASSCOCK MASTOID ADT (GAUZE/BANDAGES/DRESSINGS) ×1 IMPLANT
ELECT COATED BLADE 2.86 ST (ELECTRODE) ×2 IMPLANT
ELECT PAIRED SUBDERMAL (MISCELLANEOUS) ×2
ELECT REM PT RETURN 9FT ADLT (ELECTROSURGICAL) ×2
ELECTRODE PAIRED SUBDERMAL (MISCELLANEOUS) ×1 IMPLANT
ELECTRODE REM PT RTRN 9FT ADLT (ELECTROSURGICAL) ×1 IMPLANT
FORCEPS BIPOLAR SPETZLER 8 1.0 (NEUROSURGERY SUPPLIES) ×2 IMPLANT
GAUZE 4X4 16PLY ~~LOC~~+RFID DBL (SPONGE) ×1 IMPLANT
GAUZE SPONGE 4X4 12PLY STRL (GAUZE/BANDAGES/DRESSINGS) IMPLANT
GAUZE SPONGE 4X4 12PLY STRL LF (GAUZE/BANDAGES/DRESSINGS) ×1 IMPLANT
GLOVE SURG ENC MOIS LTX SZ6.5 (GLOVE) ×1 IMPLANT
GLOVE SURG ENC MOIS LTX SZ7.5 (GLOVE) ×2 IMPLANT
GLOVE SURG POLYISO LF SZ6.5 (GLOVE) ×2 IMPLANT
GLOVE SURG UNDER POLY LF SZ7 (GLOVE) ×1 IMPLANT
GOWN STRL REUS W/ TWL LRG LVL3 (GOWN DISPOSABLE) ×3 IMPLANT
GOWN STRL REUS W/TWL LRG LVL3 (GOWN DISPOSABLE) ×6
HEMOSTAT SURGICEL .5X2 ABSORB (HEMOSTASIS) IMPLANT
HEMOSTAT SURGICEL 2X14 (HEMOSTASIS) IMPLANT
IV NS 500ML (IV SOLUTION) ×2
IV NS 500ML BAXH (IV SOLUTION) ×1 IMPLANT
IV SET EXT 30 76VOL 4 MALE LL (IV SETS) ×2 IMPLANT
NDL HYPO 25X1 1.5 SAFETY (NEEDLE) ×1 IMPLANT
NEEDLE HYPO 25X1 1.5 SAFETY (NEEDLE) ×2 IMPLANT
NS IRRIG 1000ML POUR BTL (IV SOLUTION) ×2 IMPLANT
PACK BASIN DAY SURGERY FS (CUSTOM PROCEDURE TRAY) ×2 IMPLANT
PACK ENT DAY SURGERY (CUSTOM PROCEDURE TRAY) ×2 IMPLANT
PAD ALCOHOL SWAB (MISCELLANEOUS) ×4 IMPLANT
PENCIL SMOKE EVACUATOR (MISCELLANEOUS) ×2 IMPLANT
PROBE NERVBE PRASS .33 (MISCELLANEOUS) IMPLANT
SLEEVE IRRIGATION ELITE 7 (MISCELLANEOUS) IMPLANT
SLEEVE SCD COMPRESS KNEE MED (STOCKING) ×2 IMPLANT
SPONGE SURGIFOAM ABS GEL 12-7 (HEMOSTASIS) ×1 IMPLANT
SUT BONE WAX W31G (SUTURE) IMPLANT
SUT SILK 3 0 TIES 17X18 (SUTURE)
SUT SILK 3-0 18XBRD TIE BLK (SUTURE) IMPLANT
SUT SILK 4 0 TIES 17X18 (SUTURE) IMPLANT
SUT VIC AB 3-0 FS2 27 (SUTURE) IMPLANT
SUT VICRYL 4-0 PS2 18IN ABS (SUTURE) ×2 IMPLANT
SYR BULB EAR ULCER 3OZ GRN STR (SYRINGE) ×2 IMPLANT
TOWEL GREEN STERILE FF (TOWEL DISPOSABLE) ×3 IMPLANT
TRAY DSU PREP LF (CUSTOM PROCEDURE TRAY) ×2 IMPLANT
TUBING IRRIGATION (MISCELLANEOUS) ×2 IMPLANT

## 2020-11-16 NOTE — Op Note (Signed)
DATE OF PROCEDURE: 11/16/2020  OPERATIVE REPORT    SURGEON:  Leta Baptist, MD   PREOPERATIVE DIAGNOSIS:  1. Right ear Meniere's disease 2. Recurrent dizziness   POSTOPERATIVE DIAGNOSIS:  1. Right ear Meniere's disease 2. Recurrent dizziness   PROCEDURES PERFORMED: 1. Right endolymphatic sac decompression   ANESTHESIA:  General endotracheal tube anesthesia.   COMPLICATIONS:  None.   ESTIMATED BLOOD LOSS:  49m   INDICATION FOR PROCEDURE:   Jenna HOCKERTis a 75y.o. 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  The patient has been experiencing frequent recurrent dizziness.  She was treated with Dyazide diuretic and a low-salt diet.   According to the patient, she continued to have frequent recurrent dizziness.  The frequency and severity of the dizziness has increased over the past few months.   Based on the above findings, the decision was made for patient to undergo the above-stated procedure. The risks, benefits, alternatives, and details of the procedure were discussed with the patient.  Questions were invited and answered.  Informed consent was obtained.   DESCRIPTION OF PROCEDURE:  The patient was taken to the operating room and placed supine on the operating table.  General endotracheal tube anesthesia was induced by the anesthesiologist.  The patient was positioned and prepped and draped in a standard fashion for right ear surgery. Facial nerve monitoring electrodes were placed. The facial nerve monitoring system was functional throughout the case.   1% lidocaine with 1-100,000 epinephrine was infiltrated into the right postauricular creasel. A standard postauricular incision was made. The soft tissue covering the mastoid cortex was carefully elevated.  Using a #6 cutting bur, a standard cortical mastoidectomy procedure was performed. The tegmen and sigmoid sinus were identified. The posterior canal wall was taken down to the  level of the facial nerve. The antrum was entered. The bony covering over the sigmoid sinus and the posterior cranial fossa was removed. The endolymphatic sac was exposed and decompressed. The mastoid and middle ear space were copiously irrigated.     The postauricular incision was then closed in layers with 4-0 Vicryl and Dermabond. A Glasscock dressing was applied. That concluded the procedure for the patient.  The care of the patient was turned over to the anesthesiologist.  The patient was awakened from anesthesia without difficulty.  He was extubated and transferred to the recovery room in good condition.   OPERATIVE FINDINGS:  Right endolymphatic sac was decompressed.   SPECIMEN:  None   FOLLOWUP CARE:  The patient will be discharged home once she is awake and alert.  She will follow up in my office in 1 week.

## 2020-11-16 NOTE — Discharge Instructions (Addendum)
POSTOPERATIVE INSTRUCTIONS FOR PATIENTS HAVING MASTOIDECTOMY SURGERY  You may have nausea, vomiting, or a low grade fever for a few days after surgery. This is not unusual.  However, if the nausea and vomiting become severe or last more than one day, please call our office. Medication for nausea may be prescribed. You may take Tylenol every four hours for fever. If your fever should rise above 101 F, please contact our office. Limit your activities for one week. This includes avoiding heavy lifting (over 20lbs), vigorous exercise, and contact sports. Do not blow your nose for approximately one week.  Any accumulation in the nose should be drawn back into the throat and expectorated through the mouth to avoid infecting the ear. If it is necessary to sneeze, do so with your mouth open to decrease pressure to your ears. Do not hold your nose to avoid sneezing.  You may wash your hair after the operation. Please protect the ear and any external incision from water. We recommend placing some plastic wrap over the ear and incision to help protect against water. It may be necessary to have someone help you during the first several washings.  Try to keep the incision clean and dry. You should clean crust from the incisional area with diluted hydrogen peroxide. Any time you are going to clean your ear, please wash your hands thoroughly prior to starting. Some dull postoperative ear pain is expected. Your physician may prescribe pain medicine to help relieve your discomfort. If your postoperative pain increases and your medication is not helping, please call the office before taking any other medication that we have not prescribed or recommended. If any of the following should occur, contact Dr.Teoh:   (Office: [phone])) Persistent bleeding                                                             Persistent fever Purulent drainage (pus) from the ear or incision Increasing redness around the suture  line Persistent pain or dizziness Facial weakness Sometimes, with a larger incision behind the ear, the incision may open and drain. If it occurs, please contact our office. You may experience some popping and cracking sounds in the ear for up to several weeks. It may sound like you are "talking in a barrel" or a tunnel. This is normal and should not cause concern. Because a nerve for taste passes through your ear, it is not unusual for your taste sensation to be altered for several weeks or months. You may experience some numbness in your outer ear, earlobe, and the incision area. This is normal, and most of the numbness will be expected to fade over a period of time.                                                               Your eardrum may look "pink" or "red" for up to a month postoperatively. The red coloration is due to fluid in the middle ear. The change in color should not be confused with infection.  It is important to return to our  office for your postoperative appointment as scheduled. If for some reason you were not given a postoperative appointment, please call our office at [phone].     Post Anesthesia Home Care Instructions  Activity: Get plenty of rest for the remainder of the day. A responsible individual must stay with you for 24 hours following the procedure.  For the next 24 hours, DO NOT: -Drive a car -Paediatric nurse -Drink alcoholic beverages -Take any medication unless instructed by your physician -Make any legal decisions or sign important papers.  Meals: Start with liquid foods such as gelatin or soup. Progress to regular foods as tolerated. Avoid greasy, spicy, heavy foods. If nausea and/or vomiting occur, drink only clear liquids until the nausea and/or vomiting subsides. Call your physician if vomiting continues.  Special Instructions/Symptoms: Your throat may feel dry or sore from the anesthesia or the breathing tube placed in your throat during  surgery. If this causes discomfort, gargle with warm salt water. The discomfort should disappear within 24 hours.  If you had a scopolamine patch placed behind your ear for the management of post- operative nausea and/or vomiting:  1. The medication in the patch is effective for 72 hours, after which it should be removed.  Wrap patch in a tissue and discard in the trash. Wash hands thoroughly with soap and water. 2. You may remove the patch earlier than 72 hours if you experience unpleasant side effects which may include dry mouth, dizziness or visual disturbances. 3. Avoid touching the patch. Wash your hands with soap and water after contact with the patch.

## 2020-11-16 NOTE — Anesthesia Procedure Notes (Addendum)
Procedure Name: LMA Insertion Date/Time: 11/16/2020 11:09 AM Performed by: Maryella Shivers, CRNA Pre-anesthesia Checklist: Patient identified, Emergency Drugs available, Suction available and Patient being monitored Patient Re-evaluated:Patient Re-evaluated prior to induction Oxygen Delivery Method: Circle system utilized Preoxygenation: Pre-oxygenation with 100% oxygen Induction Type: IV induction Ventilation: Mask ventilation without difficulty LMA: LMA flexible inserted LMA Size: 4.0 Number of attempts: 1 Airway Equipment and Method: Bite block Placement Confirmation: positive ETCO2 Tube secured with: Tape Dental Injury: Teeth and Oropharynx as per pre-operative assessment

## 2020-11-16 NOTE — Transfer of Care (Deleted)
Immediate Anesthesia Transfer of Care Note  Patient: Jenna Rasmussen  Procedure(s) Performed: RIGHT ENDOLYMPHATIC [place] DECOMPRESSION (Right: Ear)  Patient Location: PACU  Anesthesia Type:General  Level of Consciousness: sedated  Airway & Oxygen Therapy: Patient Spontanous Breathing and Patient connected to face mask oxygen  Post-op Assessment: Report given to RN and Post -op Vital signs reviewed and stable  Post vital signs: Reviewed and stable  Last Vitals:  Vitals Value Taken Time  BP 141/69 11/16/20 1254  Temp    Pulse 82 11/16/20 1255  Resp 23 11/16/20 1255  SpO2 98 % 11/16/20 1255  Vitals shown include unvalidated device data.  Last Pain:  Vitals:   11/16/20 0901  TempSrc: Oral  PainSc: 0-No pain         Complications: No notable events documented.

## 2020-11-16 NOTE — Anesthesia Preprocedure Evaluation (Signed)
Anesthesia Evaluation  Patient identified by MRN, date of birth, ID band Patient awake    Reviewed: Allergy & Precautions, NPO status , Patient's Chart, lab work & pertinent test results  Airway Mallampati: II  TM Distance: >3 FB Neck ROM: Full    Dental no notable dental hx.    Pulmonary neg pulmonary ROS, former smoker,    Pulmonary exam normal breath sounds clear to auscultation       Cardiovascular negative cardio ROS Normal cardiovascular exam Rhythm:Regular Rate:Normal     Neuro/Psych negative neurological ROS  negative psych ROS   GI/Hepatic Neg liver ROS, GERD  ,  Endo/Other  negative endocrine ROS  Renal/GU negative Renal ROS  negative genitourinary   Musculoskeletal  (+) Arthritis , Osteoarthritis,    Abdominal   Peds negative pediatric ROS (+)  Hematology negative hematology ROS (+)   Anesthesia Other Findings   Reproductive/Obstetrics negative OB ROS                             Anesthesia Physical Anesthesia Plan  ASA: 2  Anesthesia Plan: General   Post-op Pain Management:    Induction: Intravenous  PONV Risk Score and Plan: 3 and Ondansetron, Dexamethasone, Midazolam and Treatment may vary due to age or medical condition  Airway Management Planned: LMA  Additional Equipment:   Intra-op Plan:   Post-operative Plan: Extubation in OR  Informed Consent: I have reviewed the patients History and Physical, chart, labs and discussed the procedure including the risks, benefits and alternatives for the proposed anesthesia with the patient or authorized representative who has indicated his/her understanding and acceptance.     Dental advisory given  Plan Discussed with: CRNA  Anesthesia Plan Comments:         Anesthesia Quick Evaluation

## 2020-11-16 NOTE — H&P (Signed)
Cc: Recurrent dizziness, Meniere's disease  HPI: The patient is a 75 year old female who returns today with her daughter.  The patient was previously seen for progressive right ear hearing loss and tinnitus. The patient was also experiencing recurrent dizziness.  She was diagnosed with right ear Meniere's disease.  The patient was treated with a low-salt diet and Dyazide diuretic.  She subsequently underwent an MRI scan, which showed no retrocochlear lesion or mass.  According to the patient, she continues to have recurrent dizziness for the past 6 months.  Each episode typically lasts for several hours.  She continues to have persistent right ear tinnitus.  She denies any significant change in her hearing.  The patient's main complaint today is severe aural pressure in her right ear, which often leads to significant headaches.  The severity of her aural pressure and headache has increased over the past 3 months.  She has been adherent to her low salt diet and daily diuretic. No other ENT, GI, or respiratory issue noted since the last visit.   Exam: General: Communicates without difficulty, well nourished, no acute distress. Head: Normocephalic, no evidence injury, no tenderness, facial buttresses intact without stepoff. Eyes: PERRL, EOMI. No scleral icterus, conjunctivae clear. Neuro: CN II exam reveals vision grossly intact.  No nystagmus at any point of gaze. The TMs are noted to be normal.  No mass, erythema, or lesions.  Nose: External evaluation reveals normal support and skin without lesions.  Dorsum is intact.  Anterior rhinoscopy reveals healthy pink mucosa over anterior aspect of inferior turbinates and intact septum.  No purulence noted. Oral:  Oral cavity and oropharynx are intact, symmetric, without erythema or edema.  Mucosa is moist without lesions. Neck: Full range of motion without pain.  There is no significant lymphadenopathy.  No masses palpable.  Thyroid bed within normal limits to  palpation.  Parotid glands and submandibular glands equal bilaterally without mass.  Trachea is midline. Neuro:  CN 2-12 grossly intact. Gait normal. Vestibular: No nystagmus at any point of gaze. The cerebellar examination is unremarkable. Vestibular: Dix Hallpike negative.   Assessment  1.  Right ear Meniere's disease resulting in recurrent dizziness, aural pressure, asymmetric right ear sensorineural hearing loss and right ear tinnitus.  2.  The patient's ear canals, tympanic membranes, and middle ear spaces are all normal.  3.  The patient continues to be symptomatic, despite treatment with a 1500 mg low-salt diet and Dyazide diuretic.   Plan  1.  The physical exam findings and the hearing test results are reviewed with the patient.  2.  The patient should continue with her low-salt diet and diuretic.  3.  Valium 2 mg p.o. b.i.d. prn dizziness.  4.  Based on her persistent symptoms, she may also benefit from undergoing right endolymphatic sac decompression surgery.  5.  The risks, benefits, alternatives and details of the surgery are extensively discussed with the patient and her daughter. Questions are invited and answered.  6.  The patient would like to proceed with the procedure.

## 2020-11-16 NOTE — Transfer of Care (Signed)
Immediate Anesthesia Transfer of Care Note  Patient: Jenna Rasmussen  Procedure(s) Performed: RIGHT ENDOLYMPHATIC [place] DECOMPRESSION (Right: Ear)  Patient Location: PACU  Anesthesia Type:General  Level of Consciousness: sedated  Airway & Oxygen Therapy: Patient Spontanous Breathing and Patient connected to face mask oxygen  Post-op Assessment: Report given to RN and Post -op Vital signs reviewed and stable  Post vital signs: Reviewed and stable  Last Vitals:  Vitals Value Taken Time  BP 141/69 11/16/20 1254  Temp    Pulse 85 11/16/20 1257  Resp 19 11/16/20 1257  SpO2 98 % 11/16/20 1257  Vitals shown include unvalidated device data.  Last Pain:  Vitals:   11/16/20 0901  TempSrc: Oral  PainSc: 0-No pain         Complications: No notable events documented.

## 2020-11-16 NOTE — Anesthesia Postprocedure Evaluation (Signed)
Anesthesia Post Note  Patient: Jenna Rasmussen  Procedure(s) Performed: RIGHT ENDOLYMPHATIC [place] DECOMPRESSION (Right: Ear)     Patient location during evaluation: PACU Anesthesia Type: General Level of consciousness: awake and alert Pain management: pain level controlled Vital Signs Assessment: post-procedure vital signs reviewed and stable Respiratory status: spontaneous breathing, nonlabored ventilation and respiratory function stable Cardiovascular status: blood pressure returned to baseline and stable Postop Assessment: no apparent nausea or vomiting Anesthetic complications: no   No notable events documented.  Last Vitals:  Vitals:   11/16/20 1415 11/16/20 1430  BP: (!) 133/58   Pulse: 72 71  Resp: 13 17  Temp:    SpO2: 98% 98%    Last Pain:  Vitals:   11/16/20 1415  TempSrc:   PainSc: Asleep                 Lynda Rainwater

## 2020-11-17 ENCOUNTER — Encounter (HOSPITAL_BASED_OUTPATIENT_CLINIC_OR_DEPARTMENT_OTHER): Payer: Self-pay | Admitting: Otolaryngology

## 2020-12-02 DIAGNOSIS — H53423 Scotoma of blind spot area, bilateral: Secondary | ICD-10-CM | POA: Diagnosis not present

## 2020-12-02 DIAGNOSIS — H35363 Drusen (degenerative) of macula, bilateral: Secondary | ICD-10-CM | POA: Diagnosis not present

## 2020-12-02 DIAGNOSIS — H401233 Low-tension glaucoma, bilateral, severe stage: Secondary | ICD-10-CM | POA: Diagnosis not present

## 2020-12-02 DIAGNOSIS — H10013 Acute follicular conjunctivitis, bilateral: Secondary | ICD-10-CM | POA: Diagnosis not present

## 2020-12-09 DIAGNOSIS — C9111 Chronic lymphocytic leukemia of B-cell type in remission: Secondary | ICD-10-CM | POA: Diagnosis not present

## 2020-12-09 DIAGNOSIS — K219 Gastro-esophageal reflux disease without esophagitis: Secondary | ICD-10-CM | POA: Diagnosis not present

## 2020-12-09 DIAGNOSIS — J471 Bronchiectasis with (acute) exacerbation: Secondary | ICD-10-CM | POA: Diagnosis not present

## 2020-12-17 DIAGNOSIS — E039 Hypothyroidism, unspecified: Secondary | ICD-10-CM | POA: Diagnosis not present

## 2020-12-17 DIAGNOSIS — E782 Mixed hyperlipidemia: Secondary | ICD-10-CM | POA: Diagnosis not present

## 2020-12-17 DIAGNOSIS — E7801 Familial hypercholesterolemia: Secondary | ICD-10-CM | POA: Diagnosis not present

## 2020-12-17 DIAGNOSIS — E7800 Pure hypercholesterolemia, unspecified: Secondary | ICD-10-CM | POA: Diagnosis not present

## 2020-12-17 DIAGNOSIS — E7849 Other hyperlipidemia: Secondary | ICD-10-CM | POA: Diagnosis not present

## 2020-12-17 DIAGNOSIS — C9111 Chronic lymphocytic leukemia of B-cell type in remission: Secondary | ICD-10-CM | POA: Diagnosis not present

## 2020-12-17 DIAGNOSIS — I10 Essential (primary) hypertension: Secondary | ICD-10-CM | POA: Diagnosis not present

## 2020-12-17 DIAGNOSIS — E1165 Type 2 diabetes mellitus with hyperglycemia: Secondary | ICD-10-CM | POA: Diagnosis not present

## 2020-12-23 DIAGNOSIS — C9111 Chronic lymphocytic leukemia of B-cell type in remission: Secondary | ICD-10-CM | POA: Diagnosis not present

## 2020-12-23 DIAGNOSIS — E7801 Familial hypercholesterolemia: Secondary | ICD-10-CM | POA: Diagnosis not present

## 2020-12-23 DIAGNOSIS — Z0000 Encounter for general adult medical examination without abnormal findings: Secondary | ICD-10-CM | POA: Diagnosis not present

## 2020-12-23 DIAGNOSIS — Z23 Encounter for immunization: Secondary | ICD-10-CM | POA: Diagnosis not present

## 2020-12-23 DIAGNOSIS — Z6820 Body mass index (BMI) 20.0-20.9, adult: Secondary | ICD-10-CM | POA: Diagnosis not present

## 2020-12-23 DIAGNOSIS — M1991 Primary osteoarthritis, unspecified site: Secondary | ICD-10-CM | POA: Diagnosis not present

## 2021-01-19 DIAGNOSIS — Z20828 Contact with and (suspected) exposure to other viral communicable diseases: Secondary | ICD-10-CM | POA: Diagnosis not present

## 2021-01-19 DIAGNOSIS — L298 Other pruritus: Secondary | ICD-10-CM | POA: Diagnosis not present

## 2021-01-19 DIAGNOSIS — R059 Cough, unspecified: Secondary | ICD-10-CM | POA: Diagnosis not present

## 2021-01-19 DIAGNOSIS — Z08 Encounter for follow-up examination after completed treatment for malignant neoplasm: Secondary | ICD-10-CM | POA: Diagnosis not present

## 2021-01-19 DIAGNOSIS — Z85828 Personal history of other malignant neoplasm of skin: Secondary | ICD-10-CM | POA: Diagnosis not present

## 2021-01-19 DIAGNOSIS — J019 Acute sinusitis, unspecified: Secondary | ICD-10-CM | POA: Diagnosis not present

## 2021-01-19 DIAGNOSIS — J471 Bronchiectasis with (acute) exacerbation: Secondary | ICD-10-CM | POA: Diagnosis not present

## 2021-02-15 DIAGNOSIS — H26493 Other secondary cataract, bilateral: Secondary | ICD-10-CM | POA: Diagnosis not present

## 2021-02-15 DIAGNOSIS — Z961 Presence of intraocular lens: Secondary | ICD-10-CM | POA: Diagnosis not present

## 2021-02-15 DIAGNOSIS — H401233 Low-tension glaucoma, bilateral, severe stage: Secondary | ICD-10-CM | POA: Insufficient documentation

## 2021-02-15 HISTORY — DX: Presence of intraocular lens: Z96.1

## 2021-02-15 HISTORY — DX: Other secondary cataract, bilateral: H26.493

## 2021-02-15 HISTORY — DX: Low-tension glaucoma, bilateral, severe stage: H40.1233

## 2021-02-23 DIAGNOSIS — Z23 Encounter for immunization: Secondary | ICD-10-CM | POA: Diagnosis not present

## 2021-02-23 DIAGNOSIS — Z20828 Contact with and (suspected) exposure to other viral communicable diseases: Secondary | ICD-10-CM | POA: Diagnosis not present

## 2021-03-02 DIAGNOSIS — C9111 Chronic lymphocytic leukemia of B-cell type in remission: Secondary | ICD-10-CM | POA: Diagnosis not present

## 2021-03-02 DIAGNOSIS — J471 Bronchiectasis with (acute) exacerbation: Secondary | ICD-10-CM | POA: Diagnosis not present

## 2021-03-02 DIAGNOSIS — R5383 Other fatigue: Secondary | ICD-10-CM | POA: Diagnosis not present

## 2021-03-02 DIAGNOSIS — Z6821 Body mass index (BMI) 21.0-21.9, adult: Secondary | ICD-10-CM | POA: Diagnosis not present

## 2021-03-18 DIAGNOSIS — H26491 Other secondary cataract, right eye: Secondary | ICD-10-CM | POA: Diagnosis not present

## 2021-03-18 DIAGNOSIS — H401233 Low-tension glaucoma, bilateral, severe stage: Secondary | ICD-10-CM | POA: Diagnosis not present

## 2021-03-18 DIAGNOSIS — H26493 Other secondary cataract, bilateral: Secondary | ICD-10-CM | POA: Diagnosis not present

## 2021-03-18 DIAGNOSIS — H35363 Drusen (degenerative) of macula, bilateral: Secondary | ICD-10-CM | POA: Diagnosis not present

## 2021-03-18 DIAGNOSIS — H35371 Puckering of macula, right eye: Secondary | ICD-10-CM | POA: Diagnosis not present

## 2021-04-03 DIAGNOSIS — Z20828 Contact with and (suspected) exposure to other viral communicable diseases: Secondary | ICD-10-CM | POA: Diagnosis not present

## 2021-04-12 DIAGNOSIS — H26492 Other secondary cataract, left eye: Secondary | ICD-10-CM | POA: Diagnosis not present

## 2021-04-15 DIAGNOSIS — J479 Bronchiectasis, uncomplicated: Secondary | ICD-10-CM | POA: Diagnosis not present

## 2021-04-15 DIAGNOSIS — R634 Abnormal weight loss: Secondary | ICD-10-CM | POA: Diagnosis not present

## 2021-04-15 DIAGNOSIS — Z6820 Body mass index (BMI) 20.0-20.9, adult: Secondary | ICD-10-CM | POA: Diagnosis not present

## 2021-04-15 DIAGNOSIS — R053 Chronic cough: Secondary | ICD-10-CM | POA: Diagnosis not present

## 2021-04-15 DIAGNOSIS — R5383 Other fatigue: Secondary | ICD-10-CM | POA: Diagnosis not present

## 2021-04-20 DIAGNOSIS — J471 Bronchiectasis with (acute) exacerbation: Secondary | ICD-10-CM | POA: Diagnosis not present

## 2021-04-20 DIAGNOSIS — J449 Chronic obstructive pulmonary disease, unspecified: Secondary | ICD-10-CM | POA: Diagnosis not present

## 2021-04-20 DIAGNOSIS — J479 Bronchiectasis, uncomplicated: Secondary | ICD-10-CM | POA: Diagnosis not present

## 2021-04-20 DIAGNOSIS — I700 Atherosclerosis of aorta: Secondary | ICD-10-CM | POA: Diagnosis not present

## 2021-04-20 DIAGNOSIS — R053 Chronic cough: Secondary | ICD-10-CM | POA: Diagnosis not present

## 2021-05-10 DIAGNOSIS — Z961 Presence of intraocular lens: Secondary | ICD-10-CM | POA: Diagnosis not present

## 2021-05-10 DIAGNOSIS — H401233 Low-tension glaucoma, bilateral, severe stage: Secondary | ICD-10-CM | POA: Diagnosis not present

## 2021-05-13 DIAGNOSIS — R5383 Other fatigue: Secondary | ICD-10-CM | POA: Diagnosis not present

## 2021-05-13 DIAGNOSIS — R053 Chronic cough: Secondary | ICD-10-CM | POA: Diagnosis not present

## 2021-05-13 DIAGNOSIS — Z6820 Body mass index (BMI) 20.0-20.9, adult: Secondary | ICD-10-CM | POA: Diagnosis not present

## 2021-05-13 DIAGNOSIS — J479 Bronchiectasis, uncomplicated: Secondary | ICD-10-CM | POA: Diagnosis not present

## 2021-05-26 DIAGNOSIS — R42 Dizziness and giddiness: Secondary | ICD-10-CM | POA: Diagnosis not present

## 2021-05-26 DIAGNOSIS — H8101 Meniere's disease, right ear: Secondary | ICD-10-CM | POA: Diagnosis not present

## 2021-05-26 DIAGNOSIS — H838X3 Other specified diseases of inner ear, bilateral: Secondary | ICD-10-CM | POA: Diagnosis not present

## 2021-05-26 DIAGNOSIS — H903 Sensorineural hearing loss, bilateral: Secondary | ICD-10-CM | POA: Diagnosis not present

## 2021-06-01 DIAGNOSIS — R5382 Chronic fatigue, unspecified: Secondary | ICD-10-CM | POA: Diagnosis not present

## 2021-06-01 DIAGNOSIS — C9111 Chronic lymphocytic leukemia of B-cell type in remission: Secondary | ICD-10-CM | POA: Diagnosis not present

## 2021-06-01 DIAGNOSIS — E612 Magnesium deficiency: Secondary | ICD-10-CM | POA: Diagnosis not present

## 2021-06-01 DIAGNOSIS — E559 Vitamin D deficiency, unspecified: Secondary | ICD-10-CM | POA: Diagnosis not present

## 2021-06-01 DIAGNOSIS — J479 Bronchiectasis, uncomplicated: Secondary | ICD-10-CM | POA: Diagnosis not present

## 2021-06-01 DIAGNOSIS — D510 Vitamin B12 deficiency anemia due to intrinsic factor deficiency: Secondary | ICD-10-CM | POA: Diagnosis not present

## 2021-06-01 DIAGNOSIS — R053 Chronic cough: Secondary | ICD-10-CM | POA: Diagnosis not present

## 2021-06-01 DIAGNOSIS — Z6820 Body mass index (BMI) 20.0-20.9, adult: Secondary | ICD-10-CM | POA: Diagnosis not present

## 2021-06-17 ENCOUNTER — Other Ambulatory Visit: Payer: Self-pay

## 2021-06-17 ENCOUNTER — Ambulatory Visit (INDEPENDENT_AMBULATORY_CARE_PROVIDER_SITE_OTHER): Payer: Medicare Other | Admitting: Emergency Medicine

## 2021-06-17 ENCOUNTER — Encounter: Payer: Self-pay | Admitting: Emergency Medicine

## 2021-06-17 VITALS — BP 116/68 | HR 74 | Temp 98.3°F | Ht 64.0 in | Wt 107.8 lb

## 2021-06-17 DIAGNOSIS — J479 Bronchiectasis, uncomplicated: Secondary | ICD-10-CM

## 2021-06-17 HISTORY — DX: Bronchiectasis, uncomplicated: J47.9

## 2021-06-17 NOTE — Progress Notes (Signed)
? ?Subjective:  ? ? Patient ID: Jenna Rasmussen, female    DOB: 09/21/1945, 76 y.o.   MRN: [id number] ? ? ?HPI ?76 year old former smoker (5-10 pack years) with a history of CLL in remission, GERD, hyperlipidemia.  She has a history of longstanding bronchiectasis.  Review of her pulmonary notes from [facility] indicate that she was treated for [facility], recurrent in 2007 (had been negative since 2004) but not treated given overall clinical stability.  Also has had positive cultures for Acinetobacter, MSSA, stenotrophomonas. ? ?Had COVID 03/2021 and received antivirals, has since been treated with Levaquin for persistent and flaring bronchitic symptoms, then later clarithromycin which she just finished. Her sputum may be a bit less, still coughing a lot especially at night. She is fatigued. Continues to cough, especially at night.  ?Patient is on Mucinex, uses pseudoephedrine as needed.  Has albuterol nebs, uses qd. She does not use chest vest or flutter.  ? ? ?Review of Systems ?As per HPi ? ?Past Medical History:  ?Diagnosis Date  ? Chronic lymphocytic leukemia of B-cell type in remission [facility])   ? GERD (gastroesophageal reflux disease)   ? Hyperlipidemia   ? Osteoarthritis   ?  ? ?Family History  ?Problem Relation Age of Onset  ? Heart disease Father   ? Heart attack Father   ? Hyperlipidemia Father   ?  ? ?Social History  ? ?Socioeconomic History  ? Marital status: Widowed  ?  Spouse name: Not on file  ? Number of children: Not on file  ? Years of education: Not on file  ? Highest education level: Not on file  ?Occupational History  ? Not on file  ?Tobacco Use  ? Smoking status: Former  ?  Types: Cigarettes  ?  Quit date: 04/12/1983  ?  Years since quitting: 38.2  ? Smokeless tobacco: Never  ? Tobacco comments:  ?  smoked x 20 yrs  ?Substance and Sexual Activity  ? Alcohol use: Never  ? Drug use: Never  ? Sexual activity: Not on file  ?Other Topics Concern  ? Not on file  ?Social History Narrative  ? Not on file   ? ?Social Determinants of Health  ? ?Financial Resource Strain: Not on file  ?Food Insecurity: Not on file  ?Transportation Needs: Not on file  ?Physical Activity: Not on file  ?Stress: Not on file  ?Social Connections: Not on file  ?Intimate Partner Violence: Not on file  ?  ? ?Allergies  ?Allergen Reactions  ? Rifampin   ?  Liver problems and patient ended up in hospital  ? Codeine   ?  Dizziness stomach upset  ? Flagyl [Metronidazole Hcl]   ?  rash  ?  ? ?Outpatient Medications Prior to Visit  ?Medication Sig Dispense Refill  ? albuterol (PROVENTIL) (2.5 MG/3ML) 0.083% nebulizer solution Take 2.5 mg by nebulization daily.    ? Ascorbic Acid (VITAMIN C) 1000 MG tablet Take 1,000 mg by mouth daily.    ? calcium-vitamin D (OSCAL WITH D) 500-200 MG-UNIT tablet Take 1 tablet by mouth daily.      ? esomeprazole (NEXIUM) 20 MG packet Take 20 mg by mouth daily before breakfast.    ? guaiFENesin (MUCINEX) 600 MG 12 hr tablet Take 600 mg by mouth daily. Over the counter     ? latanoprost (XALATAN) 0.005 % ophthalmic solution Place 1 drop into both eyes at bedtime.    ? meclizine (ANTIVERT) 25 MG tablet Take 25 mg by mouth  as needed for dizziness.    ? pseudoephedrine (SUDAFED) 30 MG tablet Take 30 mg by mouth as needed for congestion.    ? triamterene-hydrochlorothiazide (DYAZIDE) 37.5-25 MG capsule Take 1 capsule by mouth daily.    ? Turmeric 500 MG CAPS Take 1 capsule by mouth daily. W/ ginger powder    ? Ubiquinol 100 MG CAPS Take by mouth daily.    ? Brinzolamide-Brimonidine (SIMBRINZA) 1-0.2 % SUSP Apply to eye. (Patient not taking: Reported on 06/17/2021)    ? diazepam (VALIUM) 2 MG tablet Take 2 mg by mouth every 6 (six) hours as needed for anxiety. (Patient not taking: Reported on 06/17/2021)    ? Ginkgo Biloba 120 MG CAPS Take by mouth daily. (Patient not taking: Reported on 06/17/2021)    ? ?No facility-administered medications prior to visit.  ? ? ? ?   ?Objective:  ? Physical Exam ?Vitals:  ? 06/17/21 1609  ?BP:  116/68  ?Pulse: 74  ?Temp: 98.3 ?F (36.8 ?C)  ?TempSrc: Oral  ?SpO2: 95%  ?Weight: 107 lb 12.8 oz (48.9 kg)  ?Height: '5\' 4"'$  (1.626 m)  ? ?Gen: Pleasant, well-nourished, in no distress,  normal affect ? ?ENT: No lesions,  mouth clear,  oropharynx clear, no postnasal drip ? ?Neck: No JVD, no stridor ? ?Lungs: No use of accessory muscles, distant.  Some scattered expiratory wheezing with cough ? ?Cardiovascular: RRR, heart sounds normal, no murmur or gallops, no peripheral edema ? ?Musculoskeletal: No deformities, no cyanosis or clubbing ? ?Neuro: alert, awake, non focal ? ?Skin: Warm, no lesions or rash ? ? ?   ?Assessment & Plan:  ? ?Bronchiectasis ([place]) ?Initial visit for patient with cough, fatigue, dyspnea, mild sputum production currently.  She has a history of bronchiectasis.  Has been colonized with MSSA, stenotrophomonas, Acinetobacter and MAIC based on a review of notes from [facility].  She has not been at baseline since she had COVID-19 in December at which time she was treated with antivirals.  Subsequently she has received Levaquin and clarithromycin.  Still does not feel at baseline.  More cough, more fatigue and shortness of breath.  She is not using any flutter valve or chest vest, she does use Mucinex and albuterol nebs for secretion clearance.  Her cough is currently worst at night but can happen anytime of the day.  Question whether she has only been partially treated on the antibiotics above, may need Bactrim for stenotrophomonas.  Also possible that her baseline is being impacted and reset by the recent COVID-19 infection.  I think she needs repeat pulmonary function testing, possibly maintenance bronchodilator therapy.  Her CT scan of the chest from January done in [place] was overall reassuring, confirmed her bronchiectasis without any clear evidence for active infectious process.  Will check sputum for AFB, bacterial, fungal cultures and then empirically start Bactrim in case the stenotrophomonas  is the culprit here.  We will perform pulmonary function testing going forward to assess degree of obstruction. ? ?We will perform a sputum sample for cultures ?After the sputum sample has been collected please start Bactrim DS, 1 tablet twice a day for 14 days. ?Continue use your albuterol nebulizer once daily ?We will perform pulmonary function testing in next office visit.  Depending on results we may decide to add other inhaled medication. ?We reviewed your CT scan of the chest from January today. ?Follow with Dr. Lamonte Sakai next available with full pulmonary function testing on the same day.  ? ? ?Baltazar Apo, MD,  PhD ?06/17/2021, 5:01 PM ?[facility] ?[phone] or if no answer before 7:00PM call [phone] ?For any issues after 7:00PM please call eLink [phone] ? ?

## 2021-06-17 NOTE — Patient Instructions (Signed)
We will perform a sputum sample for cultures ?After the sputum sample has been collected please start Bactrim DS, 1 tablet twice a day for 14 days. ?Continue use your albuterol nebulizer once daily ?We will perform pulmonary function testing in next office visit.  Depending on results we may decide to add other inhaled medication. ?We reviewed your CT scan of the chest from January today. ?Follow with Dr. Lamonte Sakai next available with full pulmonary function testing on the same day.  ? ?

## 2021-06-17 NOTE — Assessment & Plan Note (Signed)
Initial visit for patient with cough, fatigue, dyspnea, mild sputum production currently.  She has a history of bronchiectasis.  Has been colonized with MSSA, stenotrophomonas, Acinetobacter and MAIC based on a review of notes from [facility].  She has not been at baseline since she had COVID-19 in December at which time she was treated with antivirals.  Subsequently she has received Levaquin and clarithromycin.  Still does not feel at baseline.  More cough, more fatigue and shortness of breath.  She is not using any flutter valve or chest vest, she does use Mucinex and albuterol nebs for secretion clearance.  Her cough is currently worst at night but can happen anytime of the day.  Question whether she has only been partially treated on the antibiotics above, may need Bactrim for stenotrophomonas.  Also possible that her baseline is being impacted and reset by the recent COVID-19 infection.  I think she needs repeat pulmonary function testing, possibly maintenance bronchodilator therapy.  Her CT scan of the chest from January done in [place] was overall reassuring, confirmed her bronchiectasis without any clear evidence for active infectious process.  Will check sputum for AFB, bacterial, fungal cultures and then empirically start Bactrim in case the stenotrophomonas is the culprit here.  We will perform pulmonary function testing going forward to assess degree of obstruction. ? ?We will perform a sputum sample for cultures ?After the sputum sample has been collected please start Bactrim DS, 1 tablet twice a day for 14 days. ?Continue use your albuterol nebulizer once daily ?We will perform pulmonary function testing in next office visit.  Depending on results we may decide to add other inhaled medication. ?We reviewed your CT scan of the chest from January today. ?Follow with Dr. Lamonte Sakai next available with full pulmonary function testing on the same day.  ?

## 2021-06-19 ENCOUNTER — Other Ambulatory Visit (HOSPITAL_COMMUNITY)
Admission: RE | Admit: 2021-06-19 | Discharge: 2021-06-19 | Disposition: A | Discharge status: Home / Self Care | Payer: Medicare Other | Source: Other Acute Inpatient Hospital | Attending: Emergency Medicine | Admitting: Emergency Medicine

## 2021-06-19 DIAGNOSIS — J479 Bronchiectasis, uncomplicated: Secondary | ICD-10-CM | POA: Insufficient documentation

## 2021-06-21 ENCOUNTER — Telehealth: Payer: Self-pay | Admitting: Emergency Medicine

## 2021-06-21 DIAGNOSIS — E7849 Other hyperlipidemia: Secondary | ICD-10-CM | POA: Diagnosis not present

## 2021-06-21 DIAGNOSIS — E7801 Familial hypercholesterolemia: Secondary | ICD-10-CM | POA: Diagnosis not present

## 2021-06-21 DIAGNOSIS — R5382 Chronic fatigue, unspecified: Secondary | ICD-10-CM | POA: Diagnosis not present

## 2021-06-21 DIAGNOSIS — E782 Mixed hyperlipidemia: Secondary | ICD-10-CM | POA: Diagnosis not present

## 2021-06-21 DIAGNOSIS — E7800 Pure hypercholesterolemia, unspecified: Secondary | ICD-10-CM | POA: Diagnosis not present

## 2021-06-21 DIAGNOSIS — I10 Essential (primary) hypertension: Secondary | ICD-10-CM | POA: Diagnosis not present

## 2021-06-22 LAB — CULTURE, RESPIRATORY W GRAM STAIN
Culture: NORMAL
Gram Stain: NONE SEEN

## 2021-06-22 MED ORDER — SULFAMETHOXAZOLE-TRIMETHOPRIM 800-160 MG PO TABS: 1.0000 | ORAL_TABLET | Freq: Two times a day (BID) | ORAL | 0 refills | Status: DC

## 2021-06-22 NOTE — Telephone Encounter (Signed)
Bactrim DS sent to preferred pharmacy as instructed on AVS. Patient collected sputum culture on Saturday and culture is in process.  ?Nothing further needed.  ?

## 2021-06-23 LAB — ACID FAST SMEAR (AFB, MYCOBACTERIA): Acid Fast Smear: NEGATIVE

## 2021-06-24 DIAGNOSIS — E785 Hyperlipidemia, unspecified: Secondary | ICD-10-CM | POA: Diagnosis not present

## 2021-06-24 DIAGNOSIS — Z1389 Encounter for screening for other disorder: Secondary | ICD-10-CM | POA: Diagnosis not present

## 2021-06-24 DIAGNOSIS — R059 Cough, unspecified: Secondary | ICD-10-CM | POA: Diagnosis not present

## 2021-06-24 DIAGNOSIS — Z6820 Body mass index (BMI) 20.0-20.9, adult: Secondary | ICD-10-CM | POA: Diagnosis not present

## 2021-06-24 DIAGNOSIS — M810 Age-related osteoporosis without current pathological fracture: Secondary | ICD-10-CM | POA: Diagnosis not present

## 2021-06-24 DIAGNOSIS — Z1331 Encounter for screening for depression: Secondary | ICD-10-CM | POA: Diagnosis not present

## 2021-06-29 ENCOUNTER — Telehealth: Payer: Self-pay | Admitting: Emergency Medicine

## 2021-06-29 NOTE — Telephone Encounter (Signed)
Please refer to 06/17/2021 OV note.  ? ?Patient is concerned that she is having a reaction to Bactrim. She stated med on 06/25/2021. On 06/23/2021, she developed nausea and mild diarrhea. She did not take Bactrim today and sx have improved. She has 12 tablets left.  ? ?Dr. Lamonte Sakai, please advise. Thanks   ?

## 2021-06-29 NOTE — Telephone Encounter (Signed)
Patient is aware of below message and voiced her understanding.  ?Upcoming appt 08/10/2021. ?Nothing further needed.  ? ?

## 2021-06-29 NOTE — Telephone Encounter (Signed)
Thank you  - agree with stopping it. We can reassess when we follow up OV ?

## 2021-07-02 ENCOUNTER — Telehealth: Payer: Self-pay | Admitting: Emergency Medicine

## 2021-07-02 NOTE — Telephone Encounter (Signed)
Meant for her to complete 2 weeks of Bactrim bid. ? How long did she take it before she stopped?  I think it would be ok for her to finish this script by taking it qd ?

## 2021-07-02 NOTE — Telephone Encounter (Signed)
Called and spoke with patient. She states that she took the bactrim for a week before she stopped. Advised patient that it was ok to finish this script by taking it qd. Nothing further needed. ?

## 2021-07-02 NOTE — Telephone Encounter (Signed)
Spoke with pt who feels like since stopping Bactrim coughing is worse. Pt would like to know if she can complete Bactrim at 1 daily instead of 2 daily.  ? ?Dr. Lamonte Sakai please advise.  ?

## 2021-07-14 ENCOUNTER — Institutional Professional Consult (permissible substitution): Payer: Medicare Other | Admitting: Internal Medicine

## 2021-07-17 DIAGNOSIS — Z20828 Contact with and (suspected) exposure to other viral communicable diseases: Secondary | ICD-10-CM | POA: Diagnosis not present

## 2021-07-19 ENCOUNTER — Telehealth: Payer: Self-pay | Admitting: Emergency Medicine

## 2021-07-19 NOTE — Telephone Encounter (Signed)
Sputum cultures- routine bacterial culture shows only normal airway bacteria- no disease organisms. ?                            Microscope study shows no sign of atypical AFB. The final culture for this will be reported out in 6 weeks. ?

## 2021-07-19 NOTE — Telephone Encounter (Signed)
Called and spoke with patient. She wanted to know her culture results and if they have gotten back yet.  ? ?CY, please advise.  ?

## 2021-07-19 NOTE — Telephone Encounter (Signed)
Called and spoke with patient. She verbalized understanding.   Nothing further needed.  

## 2021-07-20 DIAGNOSIS — X32XXXD Exposure to sunlight, subsequent encounter: Secondary | ICD-10-CM | POA: Diagnosis not present

## 2021-07-20 DIAGNOSIS — Z1283 Encounter for screening for malignant neoplasm of skin: Secondary | ICD-10-CM | POA: Diagnosis not present

## 2021-07-20 DIAGNOSIS — Z08 Encounter for follow-up examination after completed treatment for malignant neoplasm: Secondary | ICD-10-CM | POA: Diagnosis not present

## 2021-07-20 DIAGNOSIS — L570 Actinic keratosis: Secondary | ICD-10-CM | POA: Diagnosis not present

## 2021-07-20 DIAGNOSIS — Z85828 Personal history of other malignant neoplasm of skin: Secondary | ICD-10-CM | POA: Diagnosis not present

## 2021-07-22 ENCOUNTER — Other Ambulatory Visit: Payer: Self-pay | Admitting: *Deleted

## 2021-07-22 DIAGNOSIS — J479 Bronchiectasis, uncomplicated: Secondary | ICD-10-CM

## 2021-07-23 ENCOUNTER — Ambulatory Visit (INDEPENDENT_AMBULATORY_CARE_PROVIDER_SITE_OTHER): Payer: Medicare Other | Admitting: Emergency Medicine

## 2021-07-23 DIAGNOSIS — J479 Bronchiectasis, uncomplicated: Secondary | ICD-10-CM | POA: Diagnosis not present

## 2021-07-23 LAB — PULMONARY FUNCTION TEST
DL/VA % pred: 80 %
DL/VA: 3.3 ml/min/mmHg/L
DLCO cor % pred: 52 %
DLCO cor: 9.99 ml/min/mmHg
DLCO unc % pred: 52 %
DLCO unc: 9.99 ml/min/mmHg
FEF 25-75 Post: 0.52 L/sec
FEF 25-75 Pre: 0.55 L/sec
FEF2575-%Change-Post: -5 %
FEF2575-%Pred-Post: 31 %
FEF2575-%Pred-Pre: 32 %
FEV1-%Change-Post: -10 %
FEV1-%Pred-Post: 55 %
FEV1-%Pred-Pre: 62 %
FEV1-Post: 1.17 L
FEV1-Pre: 1.31 L
FEV1FVC-%Change-Post: -9 %
FEV1FVC-%Pred-Pre: 75 %
FEV6-%Change-Post: -1 %
FEV6-%Pred-Post: 85 %
FEV6-%Pred-Pre: 86 %
FEV6-Post: 2.28 L
FEV6-Pre: 2.31 L
FEV6FVC-%Change-Post: 0 %
FEV6FVC-%Pred-Post: 104 %
FEV6FVC-%Pred-Pre: 105 %
FVC-%Change-Post: 0 %
FVC-%Pred-Post: 81 %
FVC-%Pred-Pre: 82 %
FVC-Post: 2.29 L
FVC-Pre: 2.31 L
Post FEV1/FVC ratio: 51 %
Post FEV6/FVC ratio: 100 %
Pre FEV1/FVC ratio: 57 %
Pre FEV6/FVC Ratio: 100 %
RV % pred: 142 %
RV: 3.26 L
TLC % pred: 108 %
TLC: 5.5 L

## 2021-07-23 NOTE — Progress Notes (Signed)
Full PFT performed today. °

## 2021-07-23 NOTE — Patient Instructions (Signed)
Full PFT performed today. °

## 2021-07-26 ENCOUNTER — Telehealth: Payer: Self-pay | Admitting: Emergency Medicine

## 2021-07-26 MED ORDER — HYDROCODONE BIT-HOMATROP MBR 5-1.5 MG/5ML PO SOLN: 5.0000 mL | Freq: Four times a day (QID) | ORAL | 0 refills | Status: DC | PRN

## 2021-07-26 NOTE — Telephone Encounter (Signed)
She has bronchiectasis, so I do not expect that her cough is going to fully resolve. I do want to be able to minimize it's impact on her. Culture data is negative so far. We may want to consider rotating antibiotics for the first week of each month. I can discuss this with her when I see her 5/2. We could also try cough suppression, especially at night if cough is keeping her awake. I can send in script for hycodan if she would like. Let me know.  ?

## 2021-07-26 NOTE — Telephone Encounter (Signed)
I sent script for her.  ?Remind her that it has hydrocodone >> she has nausea with codeine.  ?

## 2021-07-26 NOTE — Telephone Encounter (Signed)
Pt had PFT performed 4/14. OV with RB is not until 5/2. ? ? ?Called and spoke with pt who states she is still coughing and states she is getting up occasional green phlegm. States that her cough is about the same since OV with  RB. ? ?Pt said cough is worse when she goes to bed at night and also first thing in the morning. ? ?Pt did say while she was on the abx that was prescribed at [facility], her cough was better but stated now the cough is worse again. ? ?Pt denies any complaints of wheezing nor does she have any complaints  of increased SOB. ? ? ?Due to pt coughing more now, pt wants to know what we might recommend to help with it. Dr. Lamonte Sakai, please advise. ?

## 2021-07-26 NOTE — Telephone Encounter (Signed)
Spoke with the pt and notified of response per Dr Lamonte Sakai  ?She verbalized understanding  ?Will call for appt if not improving ?

## 2021-07-26 NOTE — Telephone Encounter (Signed)
Called and spoke with pt letting her know the info per RB and she verbalized understanding. Pt said she will give Hycodan cough med a try to see if it would help with her cough. ? ?Routing back to Dr. Lamonte Sakai so he can send med to pharmacy for pt. ?

## 2021-08-10 ENCOUNTER — Telehealth: Payer: Self-pay | Admitting: Emergency Medicine

## 2021-08-10 ENCOUNTER — Ambulatory Visit (INDEPENDENT_AMBULATORY_CARE_PROVIDER_SITE_OTHER): Payer: Medicare Other | Admitting: Emergency Medicine

## 2021-08-10 ENCOUNTER — Encounter: Payer: Self-pay | Admitting: Emergency Medicine

## 2021-08-10 DIAGNOSIS — J479 Bronchiectasis, uncomplicated: Secondary | ICD-10-CM | POA: Diagnosis not present

## 2021-08-10 MED ORDER — BUDESONIDE-FORMOTEROL FUMARATE 160-4.5 MCG/ACT IN AERO: 2.0000 | INHALATION_SPRAY | Freq: Two times a day (BID) | RESPIRATORY_TRACT | 1 refills | Status: DC

## 2021-08-10 NOTE — Progress Notes (Signed)
? ?Subjective:  ? ? Patient ID: Jenna Rasmussen, female    DOB: 1945/09/24, 76 y.o.   MRN: [id number] ? ? ?HPI ?76 year old former smoker (5-10 pack years) with a history of CLL in remission, GERD, hyperlipidemia.  She has a history of longstanding bronchiectasis.  Review of her pulmonary notes from [facility] indicate that she was treated for [facility], recurrent in 2007 (had been negative since 2004) but not treated given overall clinical stability.  Also has had positive cultures for Acinetobacter, MSSA, stenotrophomonas. ? ?Had COVID 03/2021 and received antivirals, has since been treated with Levaquin for persistent and flaring bronchitic symptoms, then later clarithromycin which she just finished. Her sputum may be a bit less, still coughing a lot especially at night. She is fatigued. Continues to cough, especially at night.  ?Patient is on Mucinex, uses pseudoephedrine as needed.  Has albuterol nebs, uses qd. She does not use chest vest or flutter.  ? ? ?[name] 08/10/21 --44 year old woman whom I met in March 2023.  She has a history of longstanding bronchiectasis in setting of CLL in remission.  Also with a remote tobacco history.  Has been treated in the past for [facility] (2007), also colonized with Acinetobacter, MSSA, stenotrophomonas.  Treated her with a course of Bactrim based on her known history of colonization. ?Sputum: AFB 06/19/2021 >> negative, bacterial culture normal flora ? ?Pulmonary function testing 07/23/21 reviewed by me show moderately severe obstruction without a bronchodilator response, hyperinflated volumes, decreased diffusion capacity that does not fully correct for her alveolar volume. ? ? ?Review of Systems ?As per HPI ? ?Past Medical History:  ?Diagnosis Date  ? Chronic lymphocytic leukemia of B-cell type in remission [facility])   ? GERD (gastroesophageal reflux disease)   ? Hyperlipidemia   ? Osteoarthritis   ?  ? ?Family History  ?Problem Relation Age of Onset  ? Heart disease Father   ? Heart attack  Father   ? Hyperlipidemia Father   ?  ? ?Social History  ? ?Socioeconomic History  ? Marital status: Widowed  ?  Spouse name: Not on file  ? Number of children: Not on file  ? Years of education: Not on file  ? Highest education level: Not on file  ?Occupational History  ? Not on file  ?Tobacco Use  ? Smoking status: Former  ?  Types: Cigarettes  ?  Quit date: 04/12/1983  ?  Years since quitting: 38.3  ? Smokeless tobacco: Never  ? Tobacco comments:  ?  smoked x 20 yrs  ?Substance and Sexual Activity  ? Alcohol use: Never  ? Drug use: Never  ? Sexual activity: Not on file  ?Other Topics Concern  ? Not on file  ?Social History Narrative  ? Not on file  ? ?Social Determinants of Health  ? ?Financial Resource Strain: Not on file  ?Food Insecurity: Not on file  ?Transportation Needs: Not on file  ?Physical Activity: Not on file  ?Stress: Not on file  ?Social Connections: Not on file  ?Intimate Partner Violence: Not on file  ?  ? ?Allergies  ?Allergen Reactions  ? Rifampin   ?  Liver problems and patient ended up in hospital  ? Codeine   ?  Dizziness stomach upset  ? Flagyl [Metronidazole Hcl]   ?  rash  ?  ? ?Outpatient Medications Prior to Visit  ?Medication Sig Dispense Refill  ? albuterol (PROVENTIL) (2.5 MG/3ML) 0.083% nebulizer solution Take 2.5 mg by nebulization daily.    ?  Ascorbic Acid (VITAMIN C) 1000 MG tablet Take 1,000 mg by mouth daily.    ? calcium-vitamin D (OSCAL WITH D) 500-200 MG-UNIT tablet Take 1 tablet by mouth daily.      ? dorzolamide-timolol (COSOPT) 22.3-6.8 MG/ML ophthalmic solution Apply to eye.    ? esomeprazole (NEXIUM) 20 MG packet Take 20 mg by mouth daily before breakfast.    ? guaiFENesin (MUCINEX) 600 MG 12 hr tablet Take 600 mg by mouth daily. Over the counter     ? HYDROcodone bit-homatropine (HYCODAN) 5-1.5 MG/5ML syrup Take 5 mLs by mouth every 6 (six) hours as needed for cough. 120 mL 0  ? latanoprost (XALATAN) 0.005 % ophthalmic solution Place 1 drop into both eyes at bedtime.    ?  meclizine (ANTIVERT) 25 MG tablet Take 25 mg by mouth as needed for dizziness.    ? pseudoephedrine (SUDAFED) 30 MG tablet Take 30 mg by mouth as needed for congestion.    ? Turmeric 500 MG CAPS Take 1 capsule by mouth daily. W/ ginger powder    ? Ubiquinol 100 MG CAPS Take by mouth daily.    ? Brinzolamide-Brimonidine (SIMBRINZA) 1-0.2 % SUSP Apply to eye. (Patient not taking: Reported on 06/17/2021)    ? diazepam (VALIUM) 2 MG tablet Take 2 mg by mouth every 6 (six) hours as needed for anxiety.    ? Ginkgo Biloba 120 MG CAPS Take by mouth daily.    ? sulfamethoxazole-trimethoprim (BACTRIM DS) 800-160 MG tablet Take 1 tablet by mouth 2 (two) times daily. 28 tablet 0  ? triamterene-hydrochlorothiazide (DYAZIDE) 37.5-25 MG capsule Take 1 capsule by mouth daily.    ? ?No facility-administered medications prior to visit.  ? ? ? ?   ?Objective:  ? Physical Exam ?Vitals:  ? 08/10/21 0916  ?BP: 124/76  ?Pulse: 72  ?Temp: 97.6 ?F (36.4 ?C)  ?TempSrc: Oral  ?SpO2: 95%  ?Weight: 107 lb 6.4 oz (48.7 kg)  ?Height: 5' 2.5" (1.588 m)  ? ?Gen: Pleasant, well-nourished, in no distress,  normal affect ? ?ENT: No lesions,  mouth clear,  oropharynx clear, no postnasal drip ? ?Neck: No JVD, no stridor ? ?Lungs: No use of accessory muscles, distant.  Some scattered expiratory wheezing with cough ? ?Cardiovascular: RRR, heart sounds normal, no murmur or gallops, no peripheral edema ? ?Musculoskeletal: No deformities, no cyanosis or clubbing ? ?Neuro: alert, awake, non focal ? ?Skin: Warm, no lesions or rash ? ? ?   ?Assessment & Plan:  ? ?Bronchiectasis ([place]) ?Her sputum AFB is negative, bacterial normal flora.  Plan to try scheduled bronchodilator therapy to see if she gets benefit ? ?You can actually use your albuterol up to every 4 hours if you need it for shortness of breath, coughing, wheezing.  For now continue to use it every evening as you have been doing. ?We will try starting Symbicort 2 puffs twice a day on a schedule.  Rinse  and gargle after use this medication.  Keep track of whether this helps your breathing, coughing, mucus clearance.  If so we can continue it going forward. ?Follow Dr. Lamonte Sakai in 2 months to review your status, decide which medications to continue. ? ? ?Baltazar Apo, MD, PhD ?08/17/2021, 1:50 PM ?[facility] ?[phone] or if no answer before 7:00PM call [phone] ?For any issues after 7:00PM please call eLink [phone] ? ?

## 2021-08-10 NOTE — Patient Instructions (Addendum)
You can actually use your albuterol up to every 4 hours if you need it for shortness of breath, coughing, wheezing.  For now continue to use it every evening as you have been doing. ?We will try starting Symbicort 2 puffs twice a day on a schedule.  Rinse and gargle after use this medication.  Keep track of whether this helps your breathing, coughing, mucus clearance.  If so we can continue it going forward. ?Follow Dr. Lamonte Sakai in 2 months to review your status, decide which medications to continue. ? ?

## 2021-08-11 MED ORDER — FLUTICASONE FUROATE-VILANTEROL 100-25 MCG/ACT IN AEPB: 1.0000 | INHALATION_SPRAY | Freq: Every day | RESPIRATORY_TRACT | 5 refills | Status: DC

## 2021-08-11 NOTE — Telephone Encounter (Signed)
Ok to try Breo 100, although I am concerned that the powdered formulation may cause cough ?

## 2021-08-11 NOTE — Telephone Encounter (Signed)
Called and spoke with Jenna Rasmussen. She stated that Symbicort is covered by the insurance, but it has a copay of $500 for a 30 day supply. Breo and Advair are the preferred medications. She wanted to know if RB would be willing to switch her to [facility] or Advair.  ? ?RB, can you please advise? Thanks!  ?

## 2021-08-11 NOTE — Telephone Encounter (Signed)
Rx for [facility] has been sent to pharmacy for pt. Nothing further needed. ?

## 2021-08-12 ENCOUNTER — Telehealth: Payer: Self-pay | Admitting: Emergency Medicine

## 2021-08-12 NOTE — Telephone Encounter (Signed)
Spoke to patient.  ?She is requesting cheaper alternative to [facility]. She stated that the co pay is over 400 dollars.  ? ?Pharmacy team, can you guys assist with this? She stated that insurance stated that [facility] is tier one.  ?

## 2021-08-13 ENCOUNTER — Other Ambulatory Visit (HOSPITAL_COMMUNITY): Payer: Self-pay

## 2021-08-13 NOTE — Telephone Encounter (Signed)
Spoke to patient and relayed below message. ?She would like to do some research and call back with an update.  ?

## 2021-08-13 NOTE — Telephone Encounter (Signed)
Called back and briefly spoke with the pt and then the phone cut off  ?I tried calling her back x 2 and got a fast busy tone ?Will call back  ?

## 2021-08-16 NOTE — Telephone Encounter (Signed)
Spoke with the pt  ?She understands following inhaler prices provided by pharm: ? ?Ran test claims for all of the ICS/LABA results as follows:  ? ?Breo: $445.99  ?Advair diskus: $357.75  ?Adviar HFA: $457.70  ?Symbicort: $440.48  ?Dulera: non-formulary  ? ?Patient has a $490.35 deductible left to meet before prices change. Based on the plan messages I cannot determine what co-pay will be after that deductible is met.  ? ? ?She is asking if instead of inhaler she can just try using her albuterol neb solution bid. She has been using this one daily scheduled. Please advise thanks ?

## 2021-08-16 NOTE — Telephone Encounter (Signed)
If she wants to defer starting any of these inhalers then that is up to her. She can still use albuterol nebs up to every 4 hours if needed.  ?

## 2021-08-16 NOTE — Telephone Encounter (Signed)
I spoke with the pt and and let her know if response per Dr Lamonte Sakai  ?She does not seem to understand and has multiple questions regarding nebs and inhalers and their side effects  ?She also asked about taking all the of the inhalers with glaucoma  ?She had many questions and I advised her to make appt at least for video visit with a provider  ?She states will call back to do so  ?

## 2021-08-17 NOTE — Assessment & Plan Note (Signed)
Her sputum AFB is negative, bacterial normal flora.  Plan to try scheduled bronchodilator therapy to see if she gets benefit ? ?You can actually use your albuterol up to every 4 hours if you need it for shortness of breath, coughing, wheezing.  For now continue to use it every evening as you have been doing. ?We will try starting Symbicort 2 puffs twice a day on a schedule.  Rinse and gargle after use this medication.  Keep track of whether this helps your breathing, coughing, mucus clearance.  If so we can continue it going forward. ?Follow Dr. Lamonte Sakai in 2 months to review your status, decide which medications to continue. ?

## 2021-08-19 LAB — ACID FAST CULTURE WITH REFLEXED SENSITIVITIES (MYCOBACTERIA): Acid Fast Culture: NEGATIVE

## 2021-08-25 ENCOUNTER — Telehealth: Payer: Self-pay | Admitting: Emergency Medicine

## 2021-08-26 NOTE — Telephone Encounter (Signed)
ATC patient. LVMTCB. 

## 2021-08-26 NOTE — Telephone Encounter (Signed)
Called and spoke with patient. She states that she is having pain on her left side of her breast when taking a breath. Patient states this isn't the first time it's happened. Patient says she's had an antibiotic for it before and wants to know if she needs to have an antibiotic or not.   RB, please advise.

## 2021-08-26 NOTE — Telephone Encounter (Signed)
We need to get her in to be seen w a CXR to determine appropriate plan

## 2021-08-27 NOTE — Telephone Encounter (Signed)
Called and spoke to patient about Dr Sudie Bailey response to her. He would like for her to get a chest xray to go over her treatment plan. Pt asked to be scheduled in [place]. I reached out to the girls in [place] to get her scheduled with them. Nothing further needed

## 2021-08-27 NOTE — Telephone Encounter (Signed)
Patient is returning phone call. Patient phone number is [phone].

## 2021-09-13 DIAGNOSIS — Z681 Body mass index (BMI) 19 or less, adult: Secondary | ICD-10-CM | POA: Diagnosis not present

## 2021-09-13 DIAGNOSIS — J019 Acute sinusitis, unspecified: Secondary | ICD-10-CM | POA: Diagnosis not present

## 2021-09-13 DIAGNOSIS — I10 Essential (primary) hypertension: Secondary | ICD-10-CM | POA: Diagnosis not present

## 2021-09-13 DIAGNOSIS — J471 Bronchiectasis with (acute) exacerbation: Secondary | ICD-10-CM | POA: Diagnosis not present

## 2021-09-13 DIAGNOSIS — R053 Chronic cough: Secondary | ICD-10-CM | POA: Diagnosis not present

## 2021-09-20 ENCOUNTER — Telehealth: Payer: Self-pay | Admitting: Emergency Medicine

## 2021-09-20 NOTE — Telephone Encounter (Signed)
Called patient but she did not answer. Left message for her to call back.  

## 2021-09-20 NOTE — Telephone Encounter (Signed)
Patient states she called in a few weeks ago about getting a x-ray done in [place], but she has not heard anything. Patient states she would like this as soon as possible, because she is not getting better.  Please advise, call back is [phone].

## 2021-09-29 ENCOUNTER — Ambulatory Visit (HOSPITAL_COMMUNITY)
Admission: RE | Admit: 2021-09-29 | Discharge: 2021-09-29 | Disposition: A | Discharge status: Home / Self Care | Payer: Medicare Other | Source: Ambulatory Visit | Attending: Emergency Medicine | Admitting: Emergency Medicine

## 2021-09-29 ENCOUNTER — Other Ambulatory Visit: Payer: Self-pay

## 2021-09-29 DIAGNOSIS — R071 Chest pain on breathing: Secondary | ICD-10-CM | POA: Diagnosis not present

## 2021-09-29 DIAGNOSIS — R079 Chest pain, unspecified: Secondary | ICD-10-CM | POA: Diagnosis not present

## 2021-10-06 NOTE — Telephone Encounter (Signed)
Pt had cxr performed 6/21. Nothing further needed.

## 2021-10-17 ENCOUNTER — Other Ambulatory Visit: Payer: Self-pay | Admitting: Emergency Medicine

## 2021-10-19 ENCOUNTER — Other Ambulatory Visit (HOSPITAL_COMMUNITY): Payer: Self-pay

## 2021-10-19 ENCOUNTER — Encounter: Payer: Self-pay | Admitting: Emergency Medicine

## 2021-10-19 ENCOUNTER — Ambulatory Visit (INDEPENDENT_AMBULATORY_CARE_PROVIDER_SITE_OTHER): Payer: Medicare Other | Admitting: Emergency Medicine

## 2021-10-19 DIAGNOSIS — J309 Allergic rhinitis, unspecified: Secondary | ICD-10-CM

## 2021-10-19 DIAGNOSIS — K219 Gastro-esophageal reflux disease without esophagitis: Secondary | ICD-10-CM | POA: Insufficient documentation

## 2021-10-19 DIAGNOSIS — J479 Bronchiectasis, uncomplicated: Secondary | ICD-10-CM

## 2021-10-19 DIAGNOSIS — J301 Allergic rhinitis due to pollen: Secondary | ICD-10-CM | POA: Diagnosis not present

## 2021-10-19 HISTORY — DX: Allergic rhinitis, unspecified: J30.9

## 2021-10-19 MED ORDER — LORATADINE 10 MG PO TABS
10.0000 mg | ORAL_TABLET | Freq: Every day | ORAL | 11 refills | Status: DC
  Filled 2021-10-19: qty 30, 30d supply, fill #0

## 2021-10-19 NOTE — Assessment & Plan Note (Addendum)
Her cough burden is actually fairly low.  She is not on any maintenance flutter valve or chest vest.  That said she does have more mucus production than her previous baseline prior to her COVID-19 infection.  Question whether there is an allergic component as she does have chronic rhinitis, sputum production is greenish.  We will try starting scheduled loratadine to see if this is helpful.  She continued the Symbicort although unclear whether she got much benefit.  We will stop it now.  She has albuterol available, currently using it once each evening.  Continue Mucinex.  If she develops purulence, increased cough, increased dyspnea then we will repeat her sputum culture to guide therapy

## 2021-10-19 NOTE — Assessment & Plan Note (Signed)
Contributor to her cough, mucus production.  Add loratadine

## 2021-10-19 NOTE — Patient Instructions (Signed)
Please continue to use your albuterol nebulizer each evening.  You can actually use this up to every 4 hours if needed for flaring symptoms including cough or shortness of breath. We will stop Symbicort for now.  We will not order a scheduled maintenance inhaler at this time. Continue Mucinex once daily as you have been taking it. Start loratadine 10 mg once daily Continue Nexium (esomeprazole) as you have been taking it Follow with Dr Lamonte Sakai in 6 months or sooner if you have any problems

## 2021-10-19 NOTE — Progress Notes (Signed)
Subjective:    Patient ID: Jenna Rasmussen, female    DOB: 07-26-1945, 76 y.o.   MRN: [id number]   HPI  [name] 08/10/21 --3 year old woman whom I met in March 2023.  She has a history of longstanding bronchiectasis in setting of CLL in remission.  Also with a remote tobacco history.  Has been treated in the past for [facility] (2007), also colonized with Acinetobacter, MSSA, stenotrophomonas.  Treated her with a course of Bactrim based on her known history of colonization. Sputum: AFB 06/19/2021 >> negative, bacterial culture normal flora  Pulmonary function testing 07/23/21 reviewed by me show moderately severe obstruction without a bronchodilator response, hyperinflated volumes, decreased diffusion capacity that does not fully correct for her alveolar volume.   [name] 10/19/21 --[name] is 15 and has a history of former tobacco (5-10 pack years), CLL (in remission), longstanding bronchiectasis with MAIC.  Also with positive cultures in the past for MSSA, stenotrophomonas and Acinetobacter.  Her most recent sputum cultures were negative for AFB or bacteria 06/19/2021.  In May I had hoped to get her back on scheduled BD therapy, originally started Symbicort but cost barriers and no good formulary substitution noted.  She has been using albuterol every evening. She coughs up mucous, often at night, greenish. She is active, but is not back to her pre-COVID baseline. She uses benadryl prn, loratadine - but not currently on   Chest x-ray performed 09/29/2021 reviewed by me showed some coarse bronchial markings consistent with chronic bronchitic change, no infiltrates   Review of Systems As per HPI  Past Medical History:  Diagnosis Date   Chronic lymphocytic leukemia of B-cell type in remission (HCC)    GERD (gastroesophageal reflux disease)    Hyperlipidemia    Osteoarthritis      Family History  Problem Relation Age of Onset   Heart disease Father    Heart attack Father    Hyperlipidemia Father       Social History   Socioeconomic History   Marital status: Widowed    Spouse name: Not on file   Number of children: Not on file   Years of education: Not on file   Highest education level: Not on file  Occupational History   Not on file  Tobacco Use   Smoking status: Former    Types: Cigarettes    Quit date: 04/12/1983    Years since quitting: 38.5   Smokeless tobacco: Never   Tobacco comments:    smoked x 20 yrs  Substance and Sexual Activity   Alcohol use: Never   Drug use: Never   Sexual activity: Not on file  Other Topics Concern   Not on file  Social History Narrative   Not on file   Social Determinants of Health   Financial Resource Strain: Not on file  Food Insecurity: Not on file  Transportation Needs: Not on file  Physical Activity: Not on file  Stress: Not on file  Social Connections: Not on file  Intimate Partner Violence: Not on file     Allergies  Allergen Reactions   Rifampin     Liver problems and patient ended up in hospital   Codeine     Dizziness stomach upset   Flagyl [Metronidazole Hcl]     rash     Outpatient Medications Prior to Visit  Medication Sig Dispense Refill   albuterol (PROVENTIL) (2.5 MG/3ML) 0.083% nebulizer solution Take 2.5 mg by nebulization daily.     Ascorbic Acid (VITAMIN C)  1000 MG tablet Take 1,000 mg by mouth daily.     Brinzolamide-Brimonidine (SIMBRINZA) 1-0.2 % SUSP Apply to eye.     calcium-vitamin D (OSCAL WITH D) 500-200 MG-UNIT tablet Take 1 tablet by mouth daily.       dorzolamide-timolol (COSOPT) 22.3-6.8 MG/ML ophthalmic solution Apply to eye.     esomeprazole (NEXIUM) 20 MG packet Take 20 mg by mouth daily before breakfast.     fluticasone furoate-vilanterol (BREO ELLIPTA) 100-25 MCG/ACT AEPB Inhale 1 puff into the lungs daily. 60 each 5   guaiFENesin (MUCINEX) 600 MG 12 hr tablet Take 600 mg by mouth daily. Over the counter      HYDROcodone bit-homatropine (HYCODAN) 5-1.5 MG/5ML syrup Take 5 mLs by mouth  every 6 (six) hours as needed for cough. 120 mL 0   latanoprost (XALATAN) 0.005 % ophthalmic solution Place 1 drop into both eyes at bedtime.     meclizine (ANTIVERT) 25 MG tablet Take 25 mg by mouth as needed for dizziness.     pseudoephedrine (SUDAFED) 30 MG tablet Take 30 mg by mouth as needed for congestion.     Turmeric 500 MG CAPS Take 1 capsule by mouth daily. W/ ginger powder     Ubiquinol 100 MG CAPS Take by mouth daily.     diazepam (VALIUM) 2 MG tablet Take 2 mg by mouth every 6 (six) hours as needed for anxiety.     Ginkgo Biloba 120 MG CAPS Take by mouth daily.     sulfamethoxazole-trimethoprim (BACTRIM DS) 800-160 MG tablet Take 1 tablet by mouth 2 (two) times daily. 28 tablet 0   triamterene-hydrochlorothiazide (DYAZIDE) 37.5-25 MG capsule Take 1 capsule by mouth daily.     No facility-administered medications prior to visit.        Objective:   Physical Exam Vitals:   10/19/21 1123  BP: 120/70  Pulse: 67  Temp: 98.4 F (36.9 C)  TempSrc: Oral  SpO2: 95%  Weight: 105 lb (47.6 kg)  Height: 5' 2.5" (1.588 m)   Gen: Pleasant, well-nourished, in no distress,  normal affect  ENT: No lesions,  mouth clear,  oropharynx clear, no postnasal drip  Neck: No JVD, no stridor  Lungs: No use of accessory muscles, distant.  Some scattered expiratory wheezing with cough  Cardiovascular: RRR, heart sounds normal, no murmur or gallops, no peripheral edema  Musculoskeletal: No deformities, no cyanosis or clubbing  Neuro: alert, awake, non focal  Skin: Warm, no lesions or rash      Assessment & Plan:   Bronchiectasis ([place]) Her cough burden is actually fairly low.  She is not on any maintenance flutter valve or chest vest.  That said she does have more mucus production than her previous baseline prior to her COVID-19 infection.  Question whether there is an allergic component as she does have chronic rhinitis, sputum production is greenish.  We will try starting scheduled  loratadine to see if this is helpful.  She continued the Symbicort although unclear whether she got much benefit.  We will stop it now.  She has albuterol available, currently using it once each evening.  Continue Mucinex.  If she develops purulence, increased cough, increased dyspnea then we will repeat her sputum culture to guide therapy   Allergic rhinitis Contributor to her cough, mucus production.  Add loratadine  GERD (gastroesophageal reflux disease) Continue Nexium (esomeprazole) as you have been taking it   Baltazar Apo, MD, PhD 10/19/2021, 3:19 PM [facility] [phone] or  if no answer before 7:00PM call [phone] For any issues after 7:00PM please call eLink [phone]

## 2021-10-19 NOTE — Assessment & Plan Note (Signed)
Continue Nexium (esomeprazole) as you have been taking it

## 2021-10-22 ENCOUNTER — Other Ambulatory Visit (HOSPITAL_COMMUNITY): Payer: Self-pay

## 2021-11-08 DIAGNOSIS — Z961 Presence of intraocular lens: Secondary | ICD-10-CM | POA: Diagnosis not present

## 2021-11-08 DIAGNOSIS — H401233 Low-tension glaucoma, bilateral, severe stage: Secondary | ICD-10-CM | POA: Diagnosis not present

## 2021-11-23 ENCOUNTER — Telehealth: Payer: Self-pay | Admitting: Emergency Medicine

## 2021-11-23 MED ORDER — BUDESONIDE-FORMOTEROL FUMARATE 160-4.5 MCG/ACT IN AERO: 2.0000 | INHALATION_SPRAY | Freq: Two times a day (BID) | RESPIRATORY_TRACT | 5 refills | Status: DC

## 2021-11-23 NOTE — Telephone Encounter (Signed)
Called and spoke to patient about inhaler she needed refilled. Took Breo off list because that one was too expensive for her in the past. And she needed refills on her Symbicort. Nothing further needed

## 2021-11-23 NOTE — Telephone Encounter (Signed)
Thanks

## 2021-11-29 DIAGNOSIS — R569 Unspecified convulsions: Secondary | ICD-10-CM | POA: Insufficient documentation

## 2021-11-29 DIAGNOSIS — Z87891 Personal history of nicotine dependence: Secondary | ICD-10-CM | POA: Diagnosis not present

## 2021-11-29 DIAGNOSIS — R1110 Vomiting, unspecified: Secondary | ICD-10-CM | POA: Diagnosis not present

## 2021-11-29 DIAGNOSIS — G319 Degenerative disease of nervous system, unspecified: Secondary | ICD-10-CM | POA: Diagnosis not present

## 2021-11-29 DIAGNOSIS — R4182 Altered mental status, unspecified: Secondary | ICD-10-CM | POA: Diagnosis not present

## 2021-11-29 DIAGNOSIS — J479 Bronchiectasis, uncomplicated: Secondary | ICD-10-CM | POA: Diagnosis not present

## 2021-11-29 DIAGNOSIS — R9431 Abnormal electrocardiogram [ECG] [EKG]: Secondary | ICD-10-CM | POA: Diagnosis not present

## 2021-11-29 DIAGNOSIS — I771 Stricture of artery: Secondary | ICD-10-CM | POA: Diagnosis not present

## 2021-11-29 DIAGNOSIS — Z8673 Personal history of transient ischemic attack (TIA), and cerebral infarction without residual deficits: Secondary | ICD-10-CM | POA: Diagnosis not present

## 2021-11-29 DIAGNOSIS — M47814 Spondylosis without myelopathy or radiculopathy, thoracic region: Secondary | ICD-10-CM | POA: Diagnosis not present

## 2021-11-29 DIAGNOSIS — I6782 Cerebral ischemia: Secondary | ICD-10-CM | POA: Diagnosis not present

## 2021-11-29 DIAGNOSIS — R197 Diarrhea, unspecified: Secondary | ICD-10-CM | POA: Diagnosis not present

## 2021-11-29 DIAGNOSIS — A310 Pulmonary mycobacterial infection: Secondary | ICD-10-CM | POA: Diagnosis not present

## 2021-11-29 DIAGNOSIS — Z7982 Long term (current) use of aspirin: Secondary | ICD-10-CM | POA: Diagnosis not present

## 2021-11-29 DIAGNOSIS — J320 Chronic maxillary sinusitis: Secondary | ICD-10-CM | POA: Diagnosis not present

## 2021-11-29 DIAGNOSIS — R55 Syncope and collapse: Secondary | ICD-10-CM | POA: Diagnosis not present

## 2021-11-29 DIAGNOSIS — R531 Weakness: Secondary | ICD-10-CM | POA: Diagnosis not present

## 2021-11-29 DIAGNOSIS — R0602 Shortness of breath: Secondary | ICD-10-CM | POA: Diagnosis not present

## 2021-11-29 DIAGNOSIS — R410 Disorientation, unspecified: Secondary | ICD-10-CM | POA: Diagnosis not present

## 2021-11-29 DIAGNOSIS — Z791 Long term (current) use of non-steroidal anti-inflammatories (NSAID): Secondary | ICD-10-CM | POA: Diagnosis not present

## 2021-11-29 DIAGNOSIS — R109 Unspecified abdominal pain: Secondary | ICD-10-CM | POA: Diagnosis not present

## 2021-11-29 DIAGNOSIS — I6523 Occlusion and stenosis of bilateral carotid arteries: Secondary | ICD-10-CM | POA: Diagnosis not present

## 2021-11-29 DIAGNOSIS — Z885 Allergy status to narcotic agent status: Secondary | ICD-10-CM | POA: Diagnosis not present

## 2021-11-29 HISTORY — DX: Unspecified convulsions: R56.9

## 2021-11-30 DIAGNOSIS — R55 Syncope and collapse: Secondary | ICD-10-CM | POA: Diagnosis not present

## 2021-11-30 DIAGNOSIS — J0100 Acute maxillary sinusitis, unspecified: Secondary | ICD-10-CM | POA: Insufficient documentation

## 2021-11-30 DIAGNOSIS — G319 Degenerative disease of nervous system, unspecified: Secondary | ICD-10-CM | POA: Diagnosis not present

## 2021-11-30 DIAGNOSIS — R002 Palpitations: Secondary | ICD-10-CM | POA: Insufficient documentation

## 2021-11-30 DIAGNOSIS — I6381 Other cerebral infarction due to occlusion or stenosis of small artery: Secondary | ICD-10-CM | POA: Insufficient documentation

## 2021-11-30 DIAGNOSIS — R569 Unspecified convulsions: Secondary | ICD-10-CM | POA: Diagnosis not present

## 2021-11-30 DIAGNOSIS — Z8673 Personal history of transient ischemic attack (TIA), and cerebral infarction without residual deficits: Secondary | ICD-10-CM | POA: Diagnosis not present

## 2021-11-30 HISTORY — DX: Acute maxillary sinusitis, unspecified: J01.00

## 2021-11-30 HISTORY — DX: Palpitations: R00.2

## 2021-12-15 DIAGNOSIS — I10 Essential (primary) hypertension: Secondary | ICD-10-CM | POA: Diagnosis not present

## 2021-12-15 DIAGNOSIS — E039 Hypothyroidism, unspecified: Secondary | ICD-10-CM | POA: Diagnosis not present

## 2021-12-15 DIAGNOSIS — E7849 Other hyperlipidemia: Secondary | ICD-10-CM | POA: Diagnosis not present

## 2021-12-15 DIAGNOSIS — K219 Gastro-esophageal reflux disease without esophagitis: Secondary | ICD-10-CM | POA: Diagnosis not present

## 2021-12-15 DIAGNOSIS — R5383 Other fatigue: Secondary | ICD-10-CM | POA: Diagnosis not present

## 2021-12-15 DIAGNOSIS — R5382 Chronic fatigue, unspecified: Secondary | ICD-10-CM | POA: Diagnosis not present

## 2021-12-15 DIAGNOSIS — E1165 Type 2 diabetes mellitus with hyperglycemia: Secondary | ICD-10-CM | POA: Diagnosis not present

## 2021-12-27 DIAGNOSIS — R55 Syncope and collapse: Secondary | ICD-10-CM | POA: Diagnosis not present

## 2021-12-28 DIAGNOSIS — M810 Age-related osteoporosis without current pathological fracture: Secondary | ICD-10-CM | POA: Diagnosis not present

## 2021-12-30 DIAGNOSIS — J309 Allergic rhinitis, unspecified: Secondary | ICD-10-CM | POA: Diagnosis not present

## 2021-12-30 DIAGNOSIS — R55 Syncope and collapse: Secondary | ICD-10-CM | POA: Diagnosis not present

## 2021-12-30 DIAGNOSIS — R059 Cough, unspecified: Secondary | ICD-10-CM | POA: Diagnosis not present

## 2021-12-30 DIAGNOSIS — I639 Cerebral infarction, unspecified: Secondary | ICD-10-CM | POA: Diagnosis not present

## 2022-01-04 DIAGNOSIS — Z8673 Personal history of transient ischemic attack (TIA), and cerebral infarction without residual deficits: Secondary | ICD-10-CM | POA: Diagnosis not present

## 2022-01-04 DIAGNOSIS — R55 Syncope and collapse: Secondary | ICD-10-CM | POA: Diagnosis not present

## 2022-01-04 DIAGNOSIS — Z681 Body mass index (BMI) 19 or less, adult: Secondary | ICD-10-CM | POA: Diagnosis not present

## 2022-01-05 DIAGNOSIS — Z0000 Encounter for general adult medical examination without abnormal findings: Secondary | ICD-10-CM | POA: Diagnosis not present

## 2022-01-05 DIAGNOSIS — Z681 Body mass index (BMI) 19 or less, adult: Secondary | ICD-10-CM | POA: Diagnosis not present

## 2022-01-05 DIAGNOSIS — Z23 Encounter for immunization: Secondary | ICD-10-CM | POA: Diagnosis not present

## 2022-01-05 DIAGNOSIS — E7801 Familial hypercholesterolemia: Secondary | ICD-10-CM | POA: Diagnosis not present

## 2022-01-05 DIAGNOSIS — C9111 Chronic lymphocytic leukemia of B-cell type in remission: Secondary | ICD-10-CM | POA: Diagnosis not present

## 2022-01-05 DIAGNOSIS — M810 Age-related osteoporosis without current pathological fracture: Secondary | ICD-10-CM | POA: Diagnosis not present

## 2022-01-06 DIAGNOSIS — H401233 Low-tension glaucoma, bilateral, severe stage: Secondary | ICD-10-CM | POA: Diagnosis not present

## 2022-01-10 ENCOUNTER — Encounter: Payer: Self-pay | Admitting: Neurology

## 2022-01-13 IMAGING — MR MR HEAD WO/W CM
17 of 21 series · 43 of 48 positions shown · IV contrast (7 ml Gadavist)
Comparison: None.

CLINICAL DATA: Headache and vertigo for 2 months.

EXAM:
MRI HEAD WITHOUT AND WITH CONTRAST
TECHNIQUE: Multiplanar, multiecho pulse sequences of the brain and surrounding
structures were obtained without and with intravenous contrast.
CONTRAST:  7mL GADAVIST GADOBUTROL 1 MMOL/ML IV SOLN

[Series 5: DWI · axial · 4.0mm · 0.90mm/px · z∈[-84,+52]mm · 3 of 32 slices shown (1 of 6)]
[im 1/32]
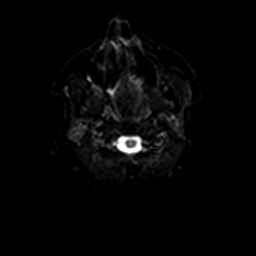
[im 16/32]
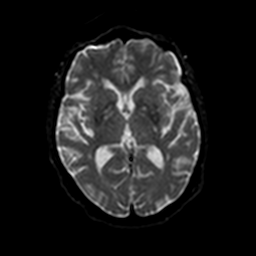
[im 32/32]
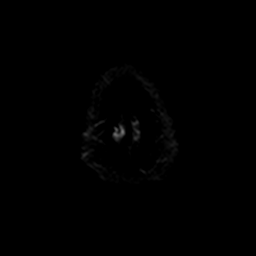

[Series 5: DWI · axial · 4.0mm · 0.90mm/px · z∈[-84,+52]mm · 3 of 32 slices shown (2 of 6)]
[im 1/32]
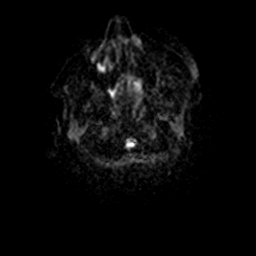
[im 16/32]
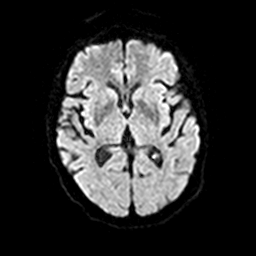
[im 32/32]
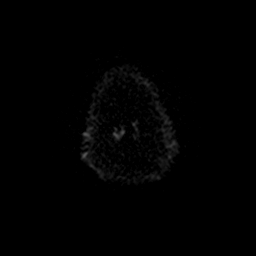

[Series 6: DWI · axial · 4.0mm · 0.90mm/px · z∈[-84,+52]mm · 2 of 32 slices shown (3 of 6)]
[im 1/32]
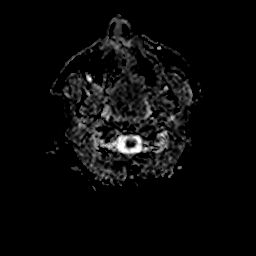
[im 32/32]
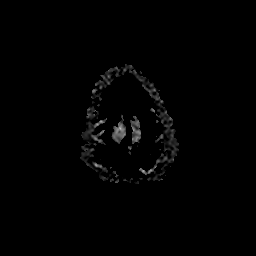

[Series 7: DWI · coronal · 4.0mm · 0.90mm/px · 2 of 32 slices shown (4 of 6)]
[im 1/32]
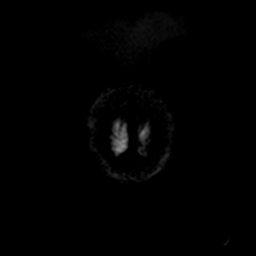
[im 32/32]
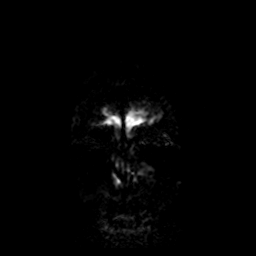

[Series 7: DWI · coronal · 4.0mm · 0.90mm/px · 2 of 32 slices shown (5 of 6)]
[im 1/32]
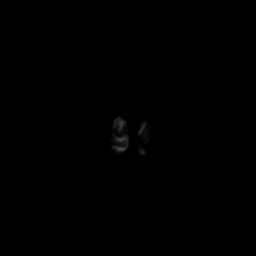
[im 32/32]
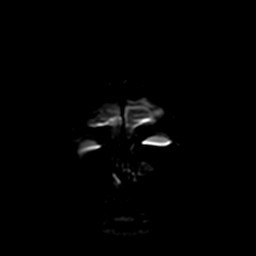

[Series 8: DWI · coronal · 4.0mm · 0.90mm/px · 2 of 32 slices shown (6 of 6)]
[im 1/32]
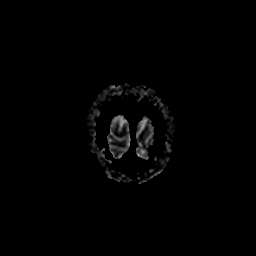
[im 32/32]
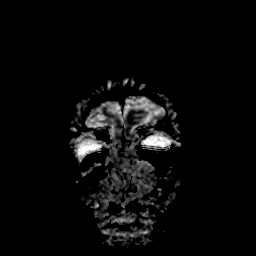

[Series 9: T1 · sagittal · 5.0mm · 0.94mm/px · 2 of 25 slices shown (1 of 2)]
[im 1/25]
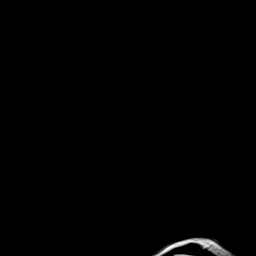
[im 25/25]
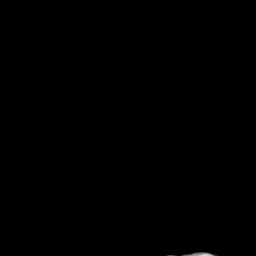

[Series 10: T2 · axial · 4.0mm · 0.45mm/px · z∈[-94,+54]mm · 2 of 35 slices shown (1 of 2)]
[im 1/35]
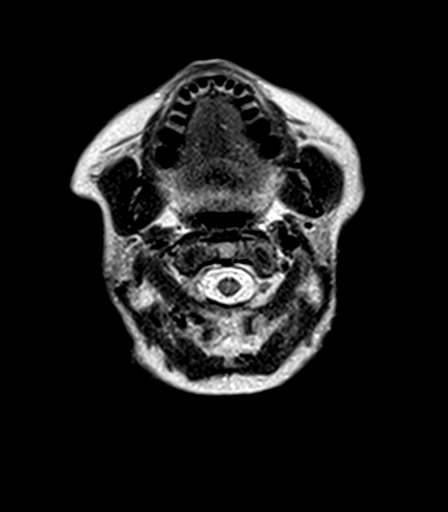
[im 35/35]
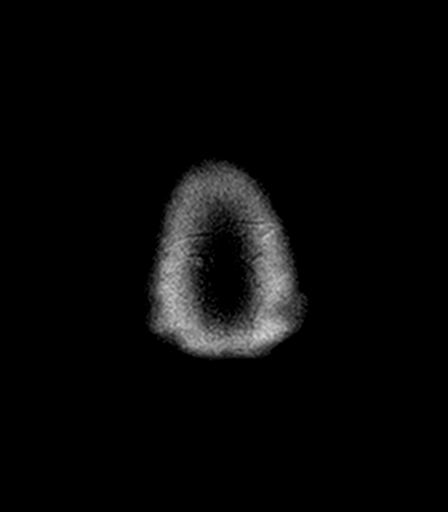

[Series 11: mag_images · axial · 3.0mm · 0.90mm/px · z∈[-105,+70]mm · 4 of 60 slices shown]
[im 1/60]
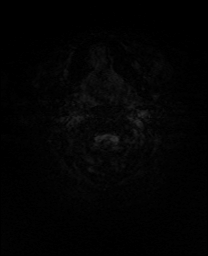
[im 20/60]
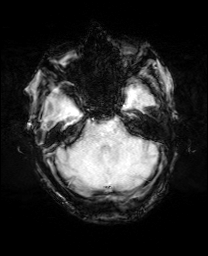
[im 40/60]
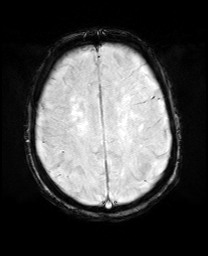
[im 60/60]
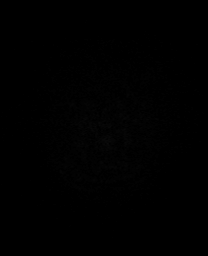

[Series 12: pha_images · axial · 3.0mm · 0.90mm/px · z∈[-105,+70]mm · 4 of 59 slices shown]
[im 1/59]
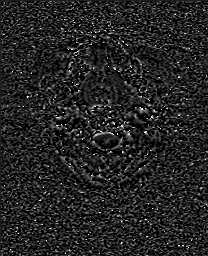
[im 20/59]
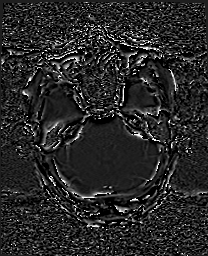
[im 39/59]
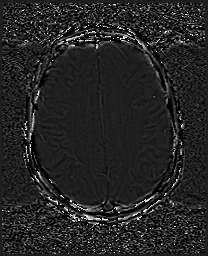
[im 59/59]
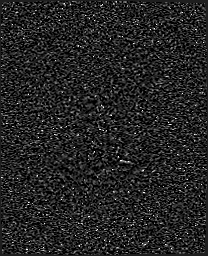

[Series 13: swi_images · axial · 3.0mm · 0.90mm/px · z∈[-105,+70]mm · 4 of 60 slices shown]
[im 1/60]
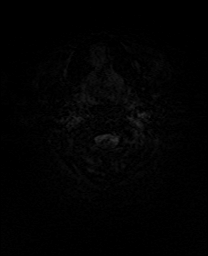
[im 20/60]
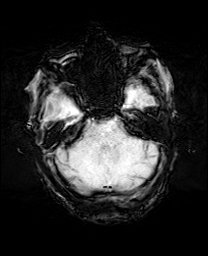
[im 40/60]
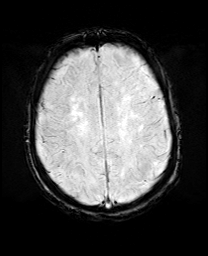
[im 60/60]
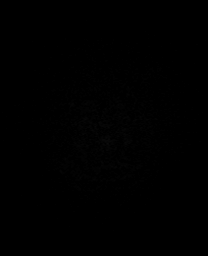

[Series 15: FLAIR · axial · 4.0mm · 0.43mm/px · z∈[-93,+55]mm · 2 of 35 slices shown]
[im 1/35]
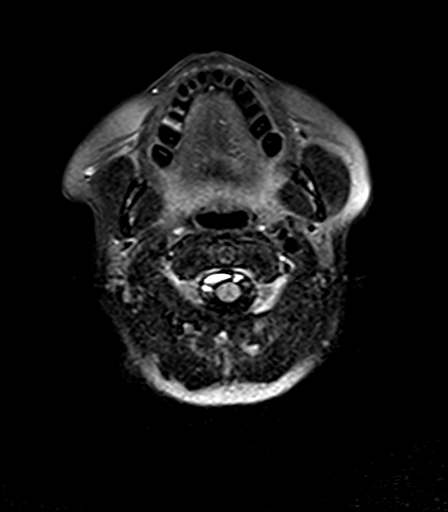
[im 35/35]
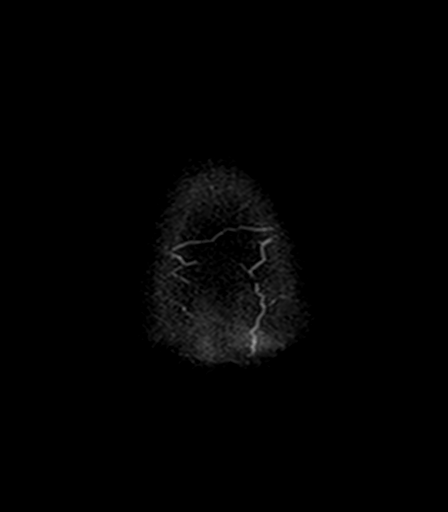

[Series 16: T1 · axial · 4.0mm · 0.90mm/px · z∈[-85,+51]mm · 2 of 32 slices shown (2 of 2)]
[im 1/32]
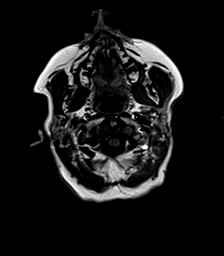
[im 32/32]
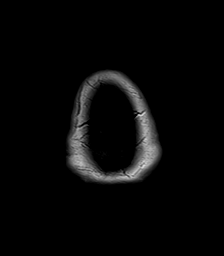

[Series 17: t2_space_tra_p2_iso · axial · 0.6mm · 0.30mm/px · z∈[-98,-72]mm · 3 of 64 slices shown]
[im 1/64]
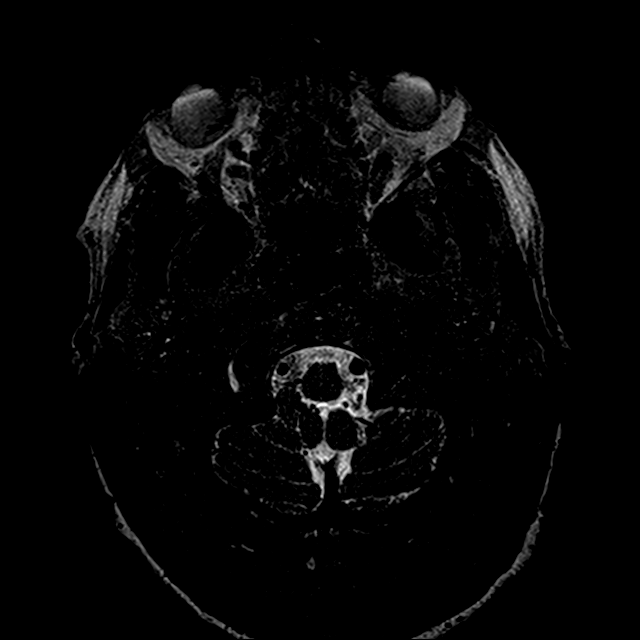
[im 22/64]
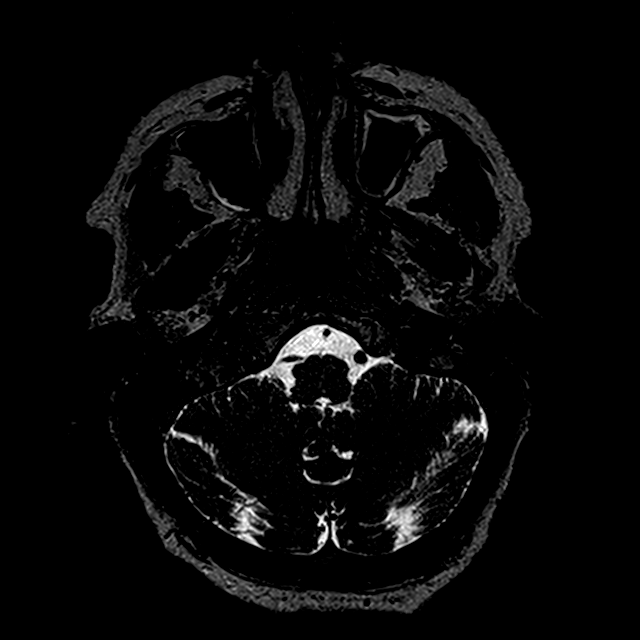
[im 43/64]
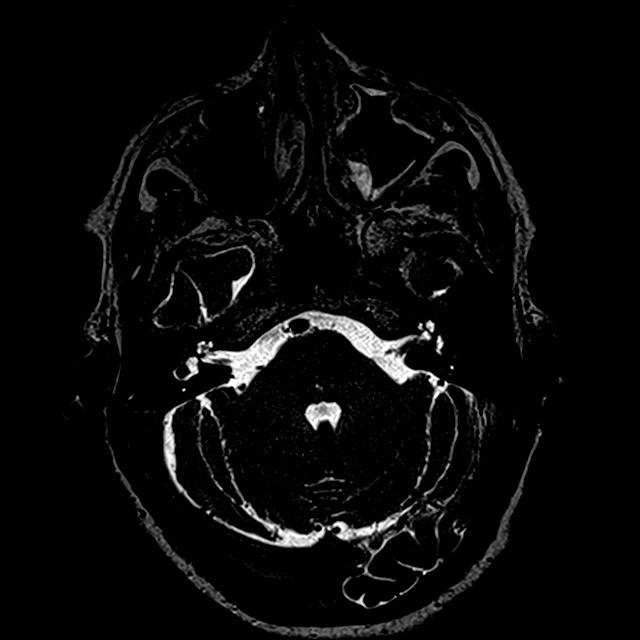

[Series 20: T2 · coronal · 4.0mm · 0.45mm/px · 2 of 32 slices shown (2 of 2)]
[im 1/32]
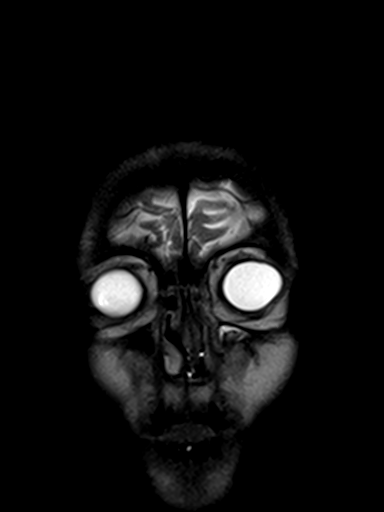
[im 32/32]
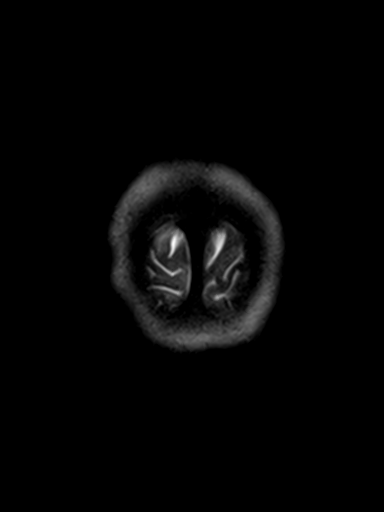

[Series 23: T1 post-contrast · axial · 4.0mm · 0.90mm/px · z∈[-85,+51]mm · 2 of 32 slices shown (1 of 2)]
[im 1/32]
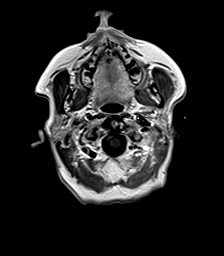
[im 32/32]
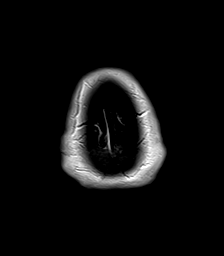

[Series 24: T1 post-contrast · coronal · 4.0mm · 0.94mm/px · 2 of 32 slices shown (2 of 2)]
[im 1/32]
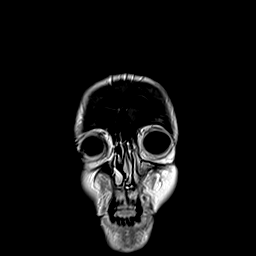
[im 32/32]
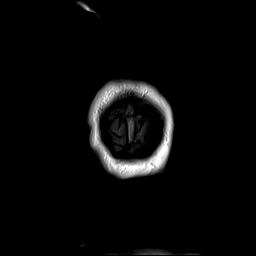

[43 of 48 positions shown; findings below may reference images not displayed]

FINDINGS: Brain: Mild generalized atrophy is present. Periventricular and
subcortical T2 hyperintensities are moderately advanced for age. The
ventricles are proportionate to the degree of atrophy. No
significant extra-axial fluid collection is present. The brainstem
and cerebellum are within normal limits.

Dedicated imaging of the internal auditory canals demonstrates no
pathologic enhancement. High-resolution imaging through the internal
auditory canals demonstrates normal appearance of the seventh and
eighth cranial nerves. The inner ear structures are normally formed
bilaterally. Other cranial nerves are within normal limits.

Postcontrast imaging through the remainder the brain is within
normal limits.

Vascular: Flow is present in the major intracranial arteries.

Skull and upper cervical spine: The craniocervical junction is
normal. Upper cervical spine is within normal limits. Marrow signal
is unremarkable.

Sinuses/Orbits: The left maxillary sinus is occluded.
Circumferential mucosal thickening is evident. Minimal mucosal
thickening is present in the left greater than right ethmoid air
cells and inferior right maxillary sinus. Frontal sinuses are clear.
The globes and orbits are within normal limits.
IMPRESSION: 1. No acute or focal abnormality to explain the patient's symptoms.
2. Mild generalized atrophy and white matter disease is moderately
advanced for age. This likely reflects the sequela of chronic
microvascular ischemia.
3. Dedicated imaging of the internal auditory canals is within
normal limits.
4. Chronic left maxillary sinus disease.

## 2022-01-24 DIAGNOSIS — Z20828 Contact with and (suspected) exposure to other viral communicable diseases: Secondary | ICD-10-CM | POA: Diagnosis not present

## 2022-01-24 DIAGNOSIS — Z681 Body mass index (BMI) 19 or less, adult: Secondary | ICD-10-CM | POA: Diagnosis not present

## 2022-01-24 DIAGNOSIS — J069 Acute upper respiratory infection, unspecified: Secondary | ICD-10-CM | POA: Diagnosis not present

## 2022-01-24 DIAGNOSIS — J40 Bronchitis, not specified as acute or chronic: Secondary | ICD-10-CM | POA: Diagnosis not present

## 2022-02-03 DIAGNOSIS — R001 Bradycardia, unspecified: Secondary | ICD-10-CM | POA: Diagnosis not present

## 2022-02-03 DIAGNOSIS — Z79899 Other long term (current) drug therapy: Secondary | ICD-10-CM | POA: Diagnosis not present

## 2022-02-03 DIAGNOSIS — R42 Dizziness and giddiness: Secondary | ICD-10-CM | POA: Diagnosis not present

## 2022-02-03 DIAGNOSIS — R112 Nausea with vomiting, unspecified: Secondary | ICD-10-CM | POA: Diagnosis not present

## 2022-02-03 DIAGNOSIS — Z87891 Personal history of nicotine dependence: Secondary | ICD-10-CM | POA: Diagnosis not present

## 2022-02-03 DIAGNOSIS — J439 Emphysema, unspecified: Secondary | ICD-10-CM | POA: Diagnosis not present

## 2022-02-03 DIAGNOSIS — R531 Weakness: Secondary | ICD-10-CM | POA: Diagnosis not present

## 2022-02-03 DIAGNOSIS — R079 Chest pain, unspecified: Secondary | ICD-10-CM | POA: Diagnosis not present

## 2022-02-03 DIAGNOSIS — R110 Nausea: Secondary | ICD-10-CM | POA: Diagnosis not present

## 2022-02-03 DIAGNOSIS — Z885 Allergy status to narcotic agent status: Secondary | ICD-10-CM | POA: Diagnosis not present

## 2022-02-03 DIAGNOSIS — Z7982 Long term (current) use of aspirin: Secondary | ICD-10-CM | POA: Diagnosis not present

## 2022-02-03 DIAGNOSIS — Z881 Allergy status to other antibiotic agents status: Secondary | ICD-10-CM | POA: Diagnosis not present

## 2022-02-03 DIAGNOSIS — Z7902 Long term (current) use of antithrombotics/antiplatelets: Secondary | ICD-10-CM | POA: Diagnosis not present

## 2022-02-03 DIAGNOSIS — R55 Syncope and collapse: Secondary | ICD-10-CM | POA: Diagnosis not present

## 2022-02-09 ENCOUNTER — Telehealth: Payer: Self-pay | Admitting: Internal Medicine

## 2022-02-09 NOTE — Telephone Encounter (Signed)
Spoke with Jenna Rasmussen at [facility] - stated that Jenna Rasmussen, Jenna Rasmussen was wanting these done since [facility] had suggested upon discharge.    Informed Jenna Rasmussen upon review of referral notes - Zio patch was done already done (12/27/21) did not reveal any sustained arrhythmia or prolonged pauses, occas PVC's and short SV/V runs present.  Carotid showing mild non-obstructive disease & Echo showing normal cardiac function with no valvular issues.    Did not see anywhere noted that a stress test was suggested to be done.   Jenna Rasmussen states that she will notify Jenna Rasmussen of this of get back to [place] if she needs anything further.

## 2022-02-09 NOTE — Telephone Encounter (Signed)
Caller would like to know if the patient will need to have a stress test and a Holter monitor prior to her appointment.

## 2022-02-11 ENCOUNTER — Encounter: Payer: Self-pay | Admitting: Neurology

## 2022-02-11 ENCOUNTER — Encounter: Payer: Self-pay | Admitting: Internal Medicine

## 2022-02-11 ENCOUNTER — Ambulatory Visit: Payer: Medicare Other | Attending: Internal Medicine | Admitting: Internal Medicine

## 2022-02-11 VITALS — BP 110/60 | HR 72 | Ht 62.5 in | Wt 100.4 lb

## 2022-02-11 DIAGNOSIS — R569 Unspecified convulsions: Secondary | ICD-10-CM | POA: Insufficient documentation

## 2022-02-11 DIAGNOSIS — R55 Syncope and collapse: Secondary | ICD-10-CM | POA: Diagnosis not present

## 2022-02-11 DIAGNOSIS — R42 Dizziness and giddiness: Secondary | ICD-10-CM | POA: Diagnosis not present

## 2022-02-11 DIAGNOSIS — I679 Cerebrovascular disease, unspecified: Secondary | ICD-10-CM | POA: Insufficient documentation

## 2022-02-11 HISTORY — DX: Syncope and collapse: R55

## 2022-02-11 NOTE — Progress Notes (Signed)
Cardiology Office Note  Date: 02/11/2022   ID: Jenna Rasmussen, DOB 1946-02-28, MRN [id number]  PCP:  [facility]  Cardiologist:  Chalmers Guest, MD Electrophysiologist:  None   Reason for Office Visit: Evaluation of presyncope at the request of PCP, [facility]   History of Present Illness: Jenna Rasmussen is a 76 y.o. female known to have GERD, CLL in remission was referred to cardiology clinic for evaluation of presyncope at the request of PCP, [facility]. Accompanied by the daughter-in-law.  Patient initially had syncopal event in August 2023: Patient was driving in the car with her daughter when she had a prodrome of lightheadedness followed by the patient bringing both her arms/hands closer to her chest and doing repetitive jerking movements. Her eyes rolled out and she eventually passed out. Her daughter was right next to her and witnessed the syncopal episode. LOC positive for 2 minutes. Patient woke up with postictal confusion.  Patient's arms/hands were rigid per the daughter at that time and also patient was complaining of urge to urinate before passing out. She was seen in the ER in August 2023 when all the work-up were unremarkable including echo, CT head, MRI brain, carotid ultrasound etc. She was discharged with a ZIO monitor which was also unremarkable. She was seeing Dr. Janece Canterbury with [facility] cardiology in September 2023 when the syncopal episode was deemed to be vagally mediated due to urgency to urinate. Patient was doing well until October last week or 2023 when she was viewing home with her realtor when she started to feel lightheaded, sat in a chair, brought both her arms/hands closer to her chest and had jerky movements similar to the first event in August 2023 (but not as long as the first event per daughter in law). In the meantime realtor called 911 and the patient was about to have a second presyncopal episode but did not have LOC.  After EMS arrived, she started to have vomiting and confusion afterwards. Denied any angina, DOE. Patient denied any lightheadedness/dizziness in between these events. Patient also clearly stated that the lightheadedness she had during the syncopal and presyncopal events are different from her vertigo.  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     alendronate (FOSAMAX) 70 MG tablet Take by mouth.     aspirin 81 MG chewable tablet Chew by mouth.     atorvastatin (LIPITOR) 40 MG tablet Take 40 mg by mouth daily.     Brinzolamide-Brimonidine (SIMBRINZA) 1-0.2 % SUSP Apply to eye.     budesonide-formoterol (SYMBICORT) 160-4.5 MCG/ACT inhaler Inhale 2 puffs into the lungs 2 (two) times daily. 1 each 5   co-enzyme Q-10 30 MG capsule Take by mouth.     dorzolamide-timolol (COSOPT) 22.3-6.8 MG/ML ophthalmic solution Apply to eye.     ibuprofen (ADVIL) 200 MG tablet Take by mouth.     latanoprost (XALATAN) 0.005 %  ophthalmic solution Place 1 drop into both eyes at bedtime.     loratadine (CLARITIN) 10 MG tablet Take 1 tablet (10 mg total) by mouth daily. 30 tablet 11   meclizine (ANTIVERT) 25 MG tablet Take 25 mg by mouth as needed for dizziness.     montelukast (SINGULAIR) 10 MG tablet Take 10 mg by mouth daily.     Ubiquinol 100 MG CAPS Take by mouth daily.     Ascorbic Acid (VITAMIN C) 1000 MG tablet Take 1,000 mg by mouth  daily. (Patient not taking: Reported on 02/11/2022)     calcium-vitamin D (OSCAL WITH D) 500-200 MG-UNIT tablet Take 1 tablet by mouth daily.   (Patient not taking: Reported on 02/11/2022)     diazepam (VALIUM) 2 MG tablet Take 2 mg by mouth every 6 (six) hours as needed for anxiety.     esomeprazole (NEXIUM) 20 MG packet Take 20 mg by mouth daily before breakfast. (Patient not taking: Reported on 02/11/2022)     Ginkgo Biloba 120 MG CAPS Take by mouth daily.     guaiFENesin (MUCINEX) 600 MG 12 hr tablet Take 600 mg by mouth daily. Over the counter  (Patient not taking: Reported on 02/11/2022)     HYDROcodone bit-homatropine (HYCODAN) 5-1.5 MG/5ML syrup Take 5 mLs by mouth every 6 (six) hours as needed for cough. (Patient not taking: Reported on 02/11/2022) 120 mL 0   sulfamethoxazole-trimethoprim (BACTRIM DS) 800-160 MG tablet Take 1 tablet by mouth 2 (two) times daily. 28 tablet 0   No current facility-administered medications for this visit.   Allergies:  Rifampin, Codeine, and Flagyl [metronidazole hcl]   Social History: The patient  reports that she quit smoking about 38 years ago. Her smoking use included cigarettes. She has never used smokeless tobacco. She reports that she does not drink alcohol and does not use drugs.   Family History: The patient's family history includes Heart attack in her father; Heart disease in her father; Hyperlipidemia in her father.   ROS:  Please see the history of present illness. Otherwise, complete review of systems is positive for none.  All other systems are reviewed and negative.   Physical Exam: VS:  BP 110/60   Pulse 72   Ht 5' 2.5" (1.588 m)   Wt 100 lb 6.4 oz (45.5 kg)   LMP  (LMP Unknown)   BMI 18.07 kg/m , BMI Body mass index is 18.07 kg/m.  Wt Readings from Last 3 Encounters:  02/11/22 100 lb 6.4 oz (45.5 kg)  10/19/21 105 lb (47.6 kg)  08/10/21 107 lb 6.4 oz (48.7 kg)    General: Patient appears comfortable at rest. HEENT: Conjunctiva  and lids normal, oropharynx clear with moist mucosa. Neck: Supple, no elevated JVP or carotid bruits, no thyromegaly. Lungs: Clear to auscultation, nonlabored breathing at rest. Cardiac: Regular rate and rhythm, no S3 or significant systolic murmur, no pericardial rub. Abdomen: Soft, nontender, no hepatomegaly, bowel sounds present, no guarding or rebound. Extremities: No pitting edema, distal pulses 2+. Skin: Warm and dry. Musculoskeletal: No kyphosis. Neuropsychiatric: Alert and oriented x3, affect grossly appropriate.  ECG:  An ECG dated 02/11/2022 was personally reviewed today and demonstrated:  Normal sinus rhythm and no ST-T changes  Recent Labwork: No results found for requested labs within last 365 days.  No results found for: "CHOL", "TRIG", "HDL", "CHOLHDL", "VLDL", "LDLCALC", "LDLDIRECT"  Other Studies Reviewed Today: Echo in August 2023 Summary   1. The left ventricular systolic function is normal, LVEF   is visually  estimated at 60-65%.    2. The right ventricle is normal in size, with normal systolic function.    3. IVC size and inspiratory change suggest mildly elevated right atrial  pressure. (5-10 mmHg).    4. There are no significant valvular abnormalities.   ZIO monitor in September 2023 Patient had a min HR of 53 bpm, max HR of 184 bpm, and avg HR of 78 bpm. Predominant underlying rhythm was Sinus Rhythm. 2 Ventricular Tachycardia runs occurred, the run with the fastest interval lasting 4 beats with a max rate of 184 bpm, the longest lasting 4 beats with an avg rate of 123 bpm. 26 Supraventricular Tachycardia runs occurred, the run with the fastest interval lasting 5 beats with a max rate of 184 bpm, the longest lasting 17 beats with an avg rate of 136 bpm. Some episodes of Supraventricular Tachycardia may be possible Atrial Tachycardia with variable block. Isolated SVEs were rare (<1.0%), SVE Couplets were rare (<1.0%), and SVE Triplets were rare (<1.0%). Isolated VEs  were occasional (1.8%, 27409), VE Couplets were rare (<1.0%, 407), and VE Triplets were rare (<1.0%, 4). Ventricular Bigeminy and Trigeminy were present. MD notification criteria for Ventricular Tachycardia met - report posted prior to notification per account request   ZIO monitor in 2020 Zio patch reviewed.  12 days and 5 hours analyzed.  Sinus rhythm is present throughout.  Heart rate ranged from 51 bpm up to 113 bpm with average heart rate 76 bpm.  Rare PACs and PVCs were noted representing less than 1% of total beats.  There were rare episodes of SVT, longest of which lasted 11 beats.  No sustained arrhythmias or pauses.   Assessment and Plan: Patient is a 75-year-old F known to have GERD, CLL in remission was referred to cardiology for evaluation of presyncope/syncope.  #Syncope #Presyncope #Ictal Bradycardia Plan -Patient had syncopal event in 8/23 and presyncopal event in 10/23 associated with rigidity and jerking movements of her upper extremities. I suspect if she has ictal bradycardia commonly associated with seizure activity. She will need evaluation with neurologist sooner rather than later. I requested the neurologist if they can expedite her appointment. In the meantime, I will obtain a tilt table study to rule out any cardioinhibitory or vasodepressor syncope. She will not benefit from an event monitor at this time due to prior unremarkable ZIO monitor in September 2023. -No driving for 6 months or until the etiology is found.   I have spent a total of 45 minutes with patient reviewing chart , telemetry, EKGs, labs and examining patient as well as establishing an assessment and plan that was discussed with the patient.  > 50% of time was spent in direct patient care.     Medication Adjustments/Labs and Tests Ordered: Current medicines are reviewed at length with the patient today.  Concerns regarding medicines are outlined above.   Tests Ordered: Orders Placed This Encounter   Procedures   EKG 12-Lead    Medication Changes: No orders of the defined types were placed in this encounter.   Disposition:  Follow up  6 weeks  Signed  Priya , MD, 02/11/2022 12:58 PM    [facility] [place] [address], [place], [place] [address]  

## 2022-02-11 NOTE — Patient Instructions (Addendum)
Medication Instructions:  Continue all current medications.  Labwork: none  Testing/Procedures: Your physician has recommended that you have a tilt table test. This test is sometimes used to help determine the cause of fainting spells. You lie on a table that moves from a lying down to an upright position. The change in position can bring on loss of consciousness. The doctor monitors your symptoms, heart rate, EKG, and blood pressure throughout the test. The doctor also may give you a medicine and then monitor your response to the medicine. This is done in the hospital and usually takes half of a day to complete the procedure. Please see the instruction sheet given to you today for more information.  Office will contact with results via phone, letter or mychart.     Follow-Up: 6  weeks  Any Other Special Instructions Will Be Listed Below (If Applicable). No driving x 6 months.    If you need a refill on your cardiac medications before your next appointment, please call your pharmacy.

## 2022-02-16 ENCOUNTER — Ambulatory Visit (INDEPENDENT_AMBULATORY_CARE_PROVIDER_SITE_OTHER): Payer: Medicare Other | Admitting: Neurology

## 2022-02-16 ENCOUNTER — Encounter: Payer: Self-pay | Admitting: Neurology

## 2022-02-16 VITALS — BP 111/63 | HR 74 | Ht 62.5 in | Wt 102.4 lb

## 2022-02-16 DIAGNOSIS — G3184 Mild cognitive impairment, so stated: Secondary | ICD-10-CM | POA: Diagnosis not present

## 2022-02-16 DIAGNOSIS — R55 Syncope and collapse: Secondary | ICD-10-CM

## 2022-02-16 NOTE — Patient Instructions (Signed)
Good to meet you.  Schedule EEG. If normal, we will plan for a 3-day home EEG  2. It is prudent to recommend that all persons should be free of syncopal episodes for at least six months to be granted the driving privilege." ([place], Second Edition, Medical Review Branch, Engineer, site, Division of [employer] of Transportation, July 2004)   3. Follow-up in 3 months, call for any changes  FALL PRECAUTIONS: Be cautious when walking. Scan the area for obstacles that may increase the risk of trips and falls. When getting up in the mornings, sit up at the edge of the bed for a few minutes before getting out of bed. Consider elevating the bed at the head end to avoid drop of blood pressure when getting up. Walk always in a well-lit room (use night lights in the walls). Avoid area rugs or power cords from appliances in the middle of the walkways. Use a walker or a cane if necessary and consider physical therapy for balance exercise. Get your eyesight checked regularly.  FINANCIAL OVERSIGHT: Supervision, especially oversight when making financial decisions or transactions is also recommended.  HOME SAFETY: Consider the safety of the kitchen when operating appliances like stoves, microwave oven, and blender. Consider having supervision and share cooking responsibilities until no longer able to participate in those. Accidents with firearms and other hazards in the house should be identified and addressed as well.  DRIVING: Regarding driving, in patients with progressive memory problems, driving will be impaired. We advise to have someone else do the driving if trouble finding directions or if minor accidents are reported. Independent driving assessment is available to determine safety of driving.  ABILITY TO BE LEFT ALONE: If patient is unable to contact 911 operator, consider using LifeLine, or when the need is  there, arrange for someone to stay with patients. Smoking is a fire hazard, consider supervision or cessation. Risk of wandering should be assessed by caregiver and if detected at any point, supervision and safe proof recommendations should be instituted.  MEDICATION SUPERVISION: Inability to self-administer medication needs to be constantly addressed. Implement a mechanism to ensure safe administration of the medications.  RECOMMENDATIONS FOR ALL PATIENTS WITH MEMORY PROBLEMS: 1. Continue to exercise (Recommend 30 minutes of walking everyday, or 3 hours every week) 2. Increase social interactions - continue going to [place] and enjoy social gatherings with friends and family 3. Eat healthy, avoid fried foods and eat more fruits and vegetables 4. Maintain adequate blood pressure, blood sugar, and blood cholesterol level. Reducing the risk of stroke and cardiovascular disease also helps promoting better memory. 5. Avoid stressful situations. Live a simple life and avoid aggravations. Organize your time and prepare for the next day in anticipation. 6. Sleep well, avoid any interruptions of sleep and avoid any distractions in the bedroom that may interfere with adequate sleep quality 7. Avoid sugar, avoid sweets as there is a strong link between excessive sugar intake, diabetes, and cognitive impairment We discussed the Mediterranean diet, which has been shown to help patients reduce the risk of progressive memory disorders and reduces cardiovascular risk. This includes eating fish, eat fruits and green leafy vegetables, nuts like almonds and hazelnuts, walnuts, and also use olive oil. Avoid fast foods and fried foods as much as possible. Avoid sweets and sugar as sugar use has been linked to worsening of memory function.       Mediterranean Diet  Why follow  it? Research shows. Those who follow the Mediterranean diet have a reduced risk of heart disease  The diet is associated with a reduced incidence  of Parkinson's and Alzheimer's diseases People following the diet may have longer life expectancies and lower rates of chronic diseases  The Dietary Guidelines for Americans recommends the Mediterranean diet as an eating plan to promote health and prevent disease  What Is the Mediterranean Diet?  Healthy eating plan based on typical foods and recipes of Mediterranean-style cooking The diet is primarily a plant based diet; these foods should make up a majority of meals   Starches - Plant based foods should make up a majority of meals - They are an important sources of vitamins, minerals, energy, antioxidants, and fiber - Choose whole grains, foods high in fiber and minimally processed items  - Typical grain sources include wheat, oats, barley, corn, brown rice, bulgar, farro, millet, polenta, couscous  - Various types of beans include chickpeas, lentils, fava beans, black beans, white beans   Fruits  Veggies - Large quantities of antioxidant rich fruits & veggies; 6 or more servings  - Vegetables can be eaten raw or lightly drizzled with oil and cooked  - Vegetables common to the traditional Mediterranean Diet include: artichokes, arugula, beets, broccoli, brussel sprouts, cabbage, carrots, celery, collard greens, cucumbers, eggplant, kale, leeks, lemons, lettuce, mushrooms, okra, onions, peas, peppers, potatoes, pumpkin, radishes, rutabaga, shallots, spinach, sweet potatoes, turnips, zucchini - Fruits common to the Mediterranean Diet include: apples, apricots, avocados, cherries, clementines, dates, figs, grapefruits, grapes, melons, nectarines, oranges, peaches, pears, pomegranates, strawberries, tangerines  Fats - Replace butter and margarine with healthy oils, such as olive oil, canola oil, and tahini  - Limit nuts to no more than a handful a day  - Nuts include walnuts, almonds, pecans, pistachios, pine nuts  - Limit or avoid candied, honey roasted or heavily salted nuts - Olives are central  to the [employer] - can be eaten whole or used in a variety of dishes   Meats Protein - Limiting red meat: no more than a few times a month - When eating red meat: choose lean cuts and keep the portion to the size of deck of cards - Eggs: approx. 0 to 4 times a week  - Fish and lean poultry: at least 2 a week  - Healthy protein sources include, chicken, [place], lean beef, lamb - Increase intake of seafood such as tuna, salmon, trout, mackerel, shrimp, scallops - Avoid or limit high fat processed meats such as sausage and bacon  Dairy - Include moderate amounts of low fat dairy products  - Focus on healthy dairy such as fat free yogurt, skim milk, low or reduced fat cheese - Limit dairy products higher in fat such as whole or 2% milk, cheese, ice cream  Alcohol - Moderate amounts of red wine is ok  - No more than 5 oz daily for women (all ages) and men older than age 68  - No more than 10 oz of wine daily for men younger than 70  Other - Limit sweets and other desserts  - Use herbs and spices instead of salt to flavor foods  - Herbs and spices common to the traditional Mediterranean Diet include: basil, bay leaves, chives, cloves, cumin, fennel, garlic, lavender, marjoram, mint, oregano, parsley, pepper, rosemary, sage, savory, sumac, tarragon, thyme   It's not just a diet, it's a lifestyle:  The Mediterranean diet includes lifestyle factors typical of those in the region  Foods, drinks and meals are best eaten with others and savored Daily physical activity is important for overall good health This could be strenuous exercise like running and aerobics This could also be more leisurely activities such as walking, housework, yard-work, or taking the stairs Moderation is the key; a balanced and healthy diet accommodates most foods and drinks Consider portion sizes and frequency of consumption of certain foods   Meal Ideas & Options:  Breakfast:  Whole wheat toast or whole wheat  English muffins with peanut butter & hard boiled egg Steel cut oats topped with apples & cinnamon and skim milk  Fresh fruit: banana, strawberries, melon, berries, peaches  Smoothies: strawberries, bananas, greek yogurt, peanut butter Low fat greek yogurt with blueberries and granola  Egg white omelet with spinach and mushrooms Breakfast couscous: whole wheat couscous, apricots, skim milk, cranberries  Sandwiches:  Hummus and grilled vegetables (peppers, zucchini, squash) on whole wheat bread   Grilled chicken on whole wheat pita with lettuce, tomatoes, cucumbers or tzatziki  [place] salad on whole wheat bread: tuna salad made with greek yogurt, olives, red peppers, capers, green onions Garlic rosemary lamb pita: lamb sauted with garlic, rosemary, salt & pepper; add lettuce, cucumber, greek yogurt to pita - flavor with lemon juice and black pepper  Seafood:  Mediterranean grilled salmon, seasoned with garlic, basil, parsley, lemon juice and black pepper Shrimp, lemon, and spinach whole-grain pasta salad made with low fat greek yogurt  Seared scallops with lemon orzo  Seared tuna steaks seasoned salt, pepper, coriander topped with tomato mixture of olives, tomatoes, olive oil, minced garlic, parsley, green onions and cappers  Meats:  Herbed greek chicken salad with kalamata olives, cucumber, feta  Red bell peppers stuffed with spinach, bulgur, lean ground beef (or lentils) & topped with feta   Kebabs: skewers of chicken, tomatoes, onions, zucchini, squash  [place] burgers: made with red onions, mint, dill, lemon juice, feta cheese topped with roasted red peppers Vegetarian Cucumber salad: cucumbers, artichoke hearts, celery, red onion, feta cheese, tossed in olive oil & lemon juice  Hummus and whole grain pita points with a greek salad (lettuce, tomato, feta, olives, cucumbers, red onion) Lentil soup with celery, carrots made with vegetable broth, garlic, salt and pepper  Tabouli salad:  parsley, bulgur, mint, scallions, cucumbers, tomato, radishes, lemon juice, olive oil, salt and pepper.

## 2022-02-16 NOTE — Progress Notes (Signed)
NEUROLOGY CONSULTATION NOTE  [name] MRN: [id number] DOB: [birth year]  Referring provider: Denny Levy, PA Primary care provider: [place]  Reason for consult:  syncope, memory loss   Thank you for your kind referral of Jenna Rasmussen for consultation of the above symptoms. Although her history is well known to you, please allow me to reiterate it for the purpose of our medical record. The patient was accompanied to the clinic by her daughter-in-law Jenna Rasmussen who also provides collateral information. Records and images were personally reviewed where available.   HISTORY OF PRESENT ILLNESS: This is a pleasant 76 year old right-handed woman with a history of hyperlipidemia, CLL in remission, presenting for evaluation of syncope and memory loss. They report that after she had Covid in 03/2021, she has been complaining of generally feeling unwell since. She would say "I can feel it in my head." Previously, she had lost her husband in June 2021 and had episodes of vertigo affecting daily activities, spending her day in bed due to anxiety of having a fall, then she had endolymphatic surgery in 11/2020 and the vertigo resolved completely. She however has had underlying anxiety/worry when she got Covid and had seen Pulmonary thinking she had an infection but did not feel any better. On 11/29/21, she was a passenger in the car and told her daughter she was not feeling well, asking to be taken home and that she needed to go to the bathroom. She then passed out and woke up to EMS around her. Her daughter reported her head went back, eyes rolled back and arms flexed, she was rigid and had shaking, making a very vocal sound ("gurgling moan"), then she was just moaning after. This lasted less than 3 minutes, she was waking up as EMS opened her door. No tongue bite or incontinence. She does nor recall feeling dizzy, no headache, focal numbness/tingling/weakness. She was very sleepy when she got  to the hospital, she was nauseated and vomited in the hospital. She was admitted overnight at [facility] where brain MRI without contrast showed chronic microvascular changes moderately severe in the cerebral white matter, progressed from prior MRI done 04/2020. 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 There was mild generalized cerebral atrophy. Carotid dopplers no significant stenosis, echocardiogram was normal. Zio patch did not show any sustained arrhythmia or prolonged pauses, occasional PVCs and short SV/V runs were present. She was seen by Cardiology in follow-up. On 02/03/22, she had another syncopal episode. She was with her realtor looking at a house, she recalls coming out and felt like she missed a step. She said she would just sit for a minute, then fel like she was getting lightheaded with the world fading. The realtor reported she was stepping down steps and sat down hard on the ground. Realtor helped her lean against the wall and asked her questions that she was able to answer. Her realtor went in to get a rag and could tell she was going to fall over so she sat next to her as she briefly passed out with a little bit of jerking. She threw up when EMS arrived and faded out again with EMS, saying she wanted to go home. She was pale with note of HR was in the upper 30s/low 40s. In the ER, HR was in the 50s. She was seen by a different cardiologist and a tilt table test has been ordered. No further syncopal  episodes since then.  Family has also been concerned about her memory. They started noticing some confusion, repeating herself, since her husband passed away in 24-Jun-2019. At that time, she was taking Xanax. It was stopped and symptoms got better but did not go away completely. She lives alone. She denies missing medications or bill payments. Jenna Rasmussen notes though that she could not tell family exactly what medication she took or when  she took it. She was previously driving and denied getting lost driving. She denies leaving the stove on. Her paternal grandmother and several aunts had dementia. In January 2023, Jenna Rasmussen recalls a strange episode. As previously mentioned, she had been feeling bad since her Covid infection the year prior, they were at a family gathering when she became very distant/removed, not answering when people asked questions but looking at them. She told them she was not feeling well with a headache, got up, then lay on the floor of her bedroom, which is unusual. She went to sleep on the floor for 30-45 minutes. Family has not noticed any staring/unresponsive episodes. She denies any other gaps in time. She denies any olfactory/gustatory hallucinations, deja vu, rising epigastric sensation, focal numbness/tingling/weakness, myoclonic jerks. Prior to her Covid infection, she did not have a history of headaches. Since then, she has had headaches or complains of her head not feeling right 5 out of 7 days in a week. She takes Tylenol which does not help, weather changes seem to affect her. She states something "feels heavy and does not feel normal." She describes a pressure in her head, she can still do her daily activities but family has noticed she does not do her chores as often, or making bread/going to grocery, like before. She denies any dizziness, diplopia, dysarthria/dysphagia, neck/back pain, bowel/bladder dysfunction, anosmia, or tremors. Her nephew has seizures. She had a normal birth and early development.  There is no history of febrile convulsions, CNS infections such as meningitis/encephalitis, significant traumatic brain injury, neurosurgical procedures.  Bloodwork at PCP office in 12/2021 showed an LDL of 58, total cholesterol of 137. Bloodwork in 05/2021 showed a normal B12 of 381, normal TSH.    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     alendronate (FOSAMAX) 70 MG tablet Take 70 mg by mouth once a week.     aspirin 81 MG chewable tablet Chew by mouth.     atorvastatin (LIPITOR) 40 MG tablet Take 40 mg by mouth daily.     b complex vitamins capsule Take 1 capsule by mouth daily.     budesonide-formoterol (SYMBICORT) 160-4.5 MCG/ACT inhaler Inhale 2 puffs into the lungs 2 (two) times daily. 1 each 5   calcium-vitamin D (OSCAL WITH D) 500-200 MG-UNIT tablet Take 1 tablet by mouth daily.     co-enzyme Q-10 30 MG capsule Take by mouth.  esomeprazole (NEXIUM) 20 MG packet Take 20 mg by mouth daily before breakfast.     ibuprofen (ADVIL) 200 MG tablet Take by mouth.     latanoprost (XALATAN) 0.005 % ophthalmic solution Place 1 drop into both eyes at bedtime.     loratadine (CLARITIN) 10 MG tablet Take 1 tablet (10 mg total) by mouth daily. 30 tablet 11   meclizine (ANTIVERT) 25 MG tablet Take 25 mg by mouth as needed for dizziness.     montelukast (SINGULAIR) 10 MG tablet Take 10 mg by mouth daily.     Ubiquinol 100 MG CAPS Take by mouth daily.     guaiFENesin (MUCINEX) 600 MG 12 hr tablet Take 600 mg by mouth daily. Over the counter  (Patient not taking: Reported on 02/11/2022)     No current facility-administered medications on file prior to visit.    ALLERGIES: Allergies  Allergen Reactions   Rifampin     Liver problems and patient  ended up in hospital   Codeine     Dizziness stomach upset   Flagyl [Metronidazole Hcl]     rash    FAMILY HISTORY: Family History  Problem Relation Age of Onset   Heart disease Father    Heart attack Father    Hyperlipidemia Father     SOCIAL HISTORY: Social History   Socioeconomic History   Marital status: Widowed    Spouse name: Not on file   Number of children: Not on file   Years of education: Not on file   Highest education level: Not on file  Occupational History   Not on file  Tobacco Use   Smoking status: Former    Types: Cigarettes    Quit date: 04/12/1983    Years since quitting: 38.8   Smokeless tobacco: Never   Tobacco comments:    smoked x 20 yrs  Vaping Use   Vaping Use: Never used  Substance and Sexual Activity   Alcohol use: Never   Drug use: Never   Sexual activity: Not on file  Other Topics Concern   Not on file  Social History Narrative   Are you right handed or left handed? Right handed   Are you currently employed ? Retired    What is your current occupation? NA   Do you live at home alone? alone   Who lives with you? NA   What type of home do you live in: 1 story or 2 story? 1 story        Social Determinants of Radio broadcast assistant Strain: Not on file  Food Insecurity: Not on file  Transportation Needs: Not on file  Physical Activity: Not on file  Stress: Not on file  Social Connections: Not on file  Intimate Partner Violence: Not on file     PHYSICAL EXAM: Vitals:   02/16/22 1249  BP: 111/63  Pulse: 74  SpO2: 95%   General: No acute distress Head:  Normocephalic/atraumatic Skin/Extremities: No rash, no edema Neurological Exam: Mental status: alert and oriented to person, place, and time, no dysarthria or aphasia, Fund of knowledge is appropriate.  Recent and remote memory are impaired. Attention and concentration are normal.  Able to name objects and repeat phrases. MoCA score 18/30.    02/16/2022    1:00 PM   Montreal Cognitive Assessment   Visuospatial/ Executive (0/5) 3  Naming (0/3) 2  Attention: Read list of digits (0/2) 2  Attention: Read list of letters (0/1) 1  Attention: Serial  7 subtraction starting at 100 (0/3) 3  Language: Repeat phrase (0/2) 1  Language : Fluency (0/1) 0  Abstraction (0/2) 0  Delayed Recall (0/5) 1  Orientation (0/6) 5  Total 18  Adjusted Score (based on education) 18   Cranial nerves: CN I: not tested CN II: pupils equal, round, visual fields intact CN III, IV, VI:  full range of motion, no nystagmus, no ptosis CN V: facial sensation intact CN VII: upper and lower face symmetric CN VIII: hearing intact to conversation Bulk & Tone: normal, no fasciculations. Motor: 5/5 throughout with no pronator drift. Sensation: intact to light touch, cold, pin, vibration sense.  No extinction to double simultaneous stimulation.  Romberg test negative Deep Tendon Reflexes: +2 throughout Cerebellar: no incoordination on finger to nose testing Gait: narrow-based and steady, difficulty with tandem walk Tremor: none   IMPRESSION: This is a pleasant 76 year old right-handed woman with a history of hyperlipidemia, CLL in remission, presenting for evaluation of syncope and memory loss. She had 2 episodes of loss of consciousness that occurred 11/2021 and 01/2022 with report of stiffening and shaking with the first episode, bradycardia. Neurological exam today normal. Etiology of syncope unclear. MRI brain no acute changes, there was moderate chronic microvascular disease and note of a lacunar infarct that was not seen on imaging from a year ago. She has had an extensive cardiac workup, no arrhythmia seen on holter. From a neurological standpoint, we discussed doing an EEG. If normal, a 72-hour EEG will be done to further characterize her symptoms. We discussed cognitive changes, MoCA 18/30 however she is overall able to continue complex tasks without difficulty, indicating Mild  Cognitive Impairment. We will continue to monitor cognitive status on future visits. [place] driving laws were discussed with the patient, and she knows to stop driving after an episode of loss of consciousness until 6 months event-free. Follow-up in 3 months, call for any changes.    Thank you for allowing me to participate in the care of this patient. Please do not hesitate to call for any questions or concerns.   Ellouise Newer, M.D.  CC: Phylis Bougie, [place] [facility]

## 2022-02-17 DIAGNOSIS — H401233 Low-tension glaucoma, bilateral, severe stage: Secondary | ICD-10-CM | POA: Diagnosis not present

## 2022-02-17 DIAGNOSIS — Z961 Presence of intraocular lens: Secondary | ICD-10-CM | POA: Diagnosis not present

## 2022-02-18 ENCOUNTER — Encounter: Payer: Self-pay | Admitting: Internal Medicine

## 2022-02-18 NOTE — Telephone Encounter (Signed)
Patient's daughter in law, [facility], is following up requesting to speak with RN regarding message below. [name] mentions that the patient not had syncopal episodes since 10/23. She also has questions regarding tilt table test. Please return call to [phone].

## 2022-02-22 ENCOUNTER — Other Ambulatory Visit: Payer: Self-pay | Admitting: *Deleted

## 2022-02-22 ENCOUNTER — Telehealth: Payer: Self-pay | Admitting: Internal Medicine

## 2022-02-22 ENCOUNTER — Other Ambulatory Visit (INDEPENDENT_AMBULATORY_CARE_PROVIDER_SITE_OTHER): Payer: Medicare Other

## 2022-02-22 ENCOUNTER — Other Ambulatory Visit: Payer: Self-pay | Admitting: Internal Medicine

## 2022-02-22 DIAGNOSIS — R55 Syncope and collapse: Secondary | ICD-10-CM

## 2022-02-22 DIAGNOSIS — R42 Dizziness and giddiness: Secondary | ICD-10-CM

## 2022-02-22 NOTE — Telephone Encounter (Signed)
TILT TABLE TESTING   -[facility]  fax order to [phone] (faxed)  they will contact the patient.  Telephone # [phone]   Spoke with Mrs. Gervin and explained the protocol. Advised patient if she has not heard anything from [facility] to contact our office back in a few days.

## 2022-02-22 NOTE — Telephone Encounter (Signed)
PERCERT  7 Day ZIO AT/enrolled 02/22/22/[place]

## 2022-02-22 NOTE — Addendum Note (Signed)
Addended by: Merlene Laughter on: 02/22/2022 02:23 PM   Modules accepted: Orders

## 2022-02-22 NOTE — Progress Notes (Addendum)
Tilt table test to be done at outside hospital ([place]) [facility] don't do Tilt Table test  Will arrange at [facility]

## 2022-02-25 ENCOUNTER — Ambulatory Visit: Payer: Medicare Other | Admitting: Internal Medicine

## 2022-02-25 DIAGNOSIS — R55 Syncope and collapse: Secondary | ICD-10-CM

## 2022-02-26 DIAGNOSIS — R55 Syncope and collapse: Secondary | ICD-10-CM | POA: Diagnosis not present

## 2022-03-01 DIAGNOSIS — K219 Gastro-esophageal reflux disease without esophagitis: Secondary | ICD-10-CM | POA: Diagnosis not present

## 2022-03-01 DIAGNOSIS — R053 Chronic cough: Secondary | ICD-10-CM | POA: Diagnosis not present

## 2022-03-01 DIAGNOSIS — Z681 Body mass index (BMI) 19 or less, adult: Secondary | ICD-10-CM | POA: Diagnosis not present

## 2022-03-01 DIAGNOSIS — R5383 Other fatigue: Secondary | ICD-10-CM | POA: Diagnosis not present

## 2022-03-01 DIAGNOSIS — R030 Elevated blood-pressure reading, without diagnosis of hypertension: Secondary | ICD-10-CM | POA: Diagnosis not present

## 2022-03-01 DIAGNOSIS — R634 Abnormal weight loss: Secondary | ICD-10-CM | POA: Diagnosis not present

## 2022-03-01 DIAGNOSIS — Z20828 Contact with and (suspected) exposure to other viral communicable diseases: Secondary | ICD-10-CM | POA: Diagnosis not present

## 2022-03-02 ENCOUNTER — Encounter: Payer: Self-pay | Admitting: Emergency Medicine

## 2022-03-02 NOTE — Telephone Encounter (Signed)
If they are going to check for alternatives then we need to check for ICS/LABA.  I would prefer Dulera since it is nonpowdered.  Other alternatives would include Breo, Advair

## 2022-03-08 ENCOUNTER — Telehealth: Payer: Self-pay | Admitting: Internal Medicine

## 2022-03-08 NOTE — Telephone Encounter (Signed)
[facility] regarding the tilt table test.  They are the only hospital that does this test.  Left message with Candace procedure scheduler. [phone]. Explained what [name] wants to order. I left my private telephone # for her to return my call.

## 2022-03-08 NOTE — Telephone Encounter (Signed)
Daughter -in -law called requesting to see if we have results of the heart monitor. Also, patient is wanting to know if [name] really feels that the Tilt table test is necessary.The only hospital that can do this test is [facility] and patient would rather not go that far unless necessary.

## 2022-03-08 NOTE — Telephone Encounter (Signed)
Daughter in law, Lenis Dickinson called with concerns about patient having the tilt test and if it is necessary being that [facility] in [place] is the only facility close by that can perform this test. States that she does not want to have to put the patient through this if this is something that is not really necessary. States that the patient has appointment with neurology in [facility] tomorrow. Please advise

## 2022-03-09 ENCOUNTER — Ambulatory Visit (INDEPENDENT_AMBULATORY_CARE_PROVIDER_SITE_OTHER): Payer: Medicare Other | Admitting: Neurology

## 2022-03-09 DIAGNOSIS — R55 Syncope and collapse: Secondary | ICD-10-CM

## 2022-03-09 NOTE — Telephone Encounter (Signed)
Daughter made aware, verbalized understanding.

## 2022-03-09 NOTE — Progress Notes (Signed)
EEG complete - results pending 

## 2022-03-09 NOTE — Telephone Encounter (Signed)
Received telephone call from [facility] at [facility]. He states that we need to find out from [name] if they do the Tilt Table Study on Jenna Rasmussen and the test comes back positive what will be the next step. [name] perfers to see the patient before having this test scheduled. If no referral is planned we will next to fax an order stating that about a No Referral to the attention of [name] fax # [phone]. The direct telephone # for Jenna Rasmussen [phone].

## 2022-03-18 ENCOUNTER — Telehealth: Payer: Self-pay | Admitting: Neurology

## 2022-03-18 DIAGNOSIS — R55 Syncope and collapse: Secondary | ICD-10-CM

## 2022-03-18 NOTE — Procedures (Signed)
ELECTROENCEPHALOGRAM REPORT  Date of Study: 03/09/2022  Patient's Name: Jenna Rasmussen MRN: [id number] Date of Birth: 07-10-1945  Referring Provider: Dr. Ellouise Newer  Clinical History: This is a 76 year old woman with 2 episodes of loss of consciousness that occurred 11/2021 and 01/2022 with report of stiffening and shaking with the first episode, bradycardia. EEG for classification.  Medications: Aspirin, lipitor, fosamax  Technical Summary: A multichannel digital  EEG recording measured by the international 10-20 system with electrodes applied with paste and impedances below 5000 ohms performed in our laboratory with EKG monitoring in an awake and asleep patient.  Hyperventilation was not performed. Photic stimulation were performed.  The digital EEG was referentially recorded, reformatted, and digitally filtered in a variety of bipolar and referential montages for optimal display.    Description: The patient is awake and asleep during the recording.  During maximal wakefulness, there is a symmetric, medium voltage 9-10 Hz posterior dominant rhythm that attenuates with eye opening.  The record is symmetric.  During drowsiness and sleep, there is an increase in theta and delta slowing of the background.  Vertex waves and symmetric sleep spindles were seen. Photic stimulation did not elicit any abnormalities.  There were no epileptiform discharges or electrographic seizures seen.    EKG lead was unremarkable.  Impression: This awake and asleep EEG is normal.    Clinical Correlation: A normal EEG does not exclude a clinical diagnosis of epilepsy.  If further clinical questions remain, prolonged EEG may be helpful.  Clinical correlation is advised.   Ellouise Newer, M.D.

## 2022-03-18 NOTE — Telephone Encounter (Signed)
Pls let them know the EEG was normal. We had discussed doing a 3-day EEG if normal, pls order 72-hour EEG. Thanks

## 2022-03-18 NOTE — Telephone Encounter (Signed)
They would like to get the results of the EEG that was done on 03-09-22

## 2022-03-18 NOTE — Telephone Encounter (Signed)
Pt daughter in law called informed the EEG was normal. We had discussed doing a 3-day EEG if normal. Order placed for 72 hour

## 2022-03-23 ENCOUNTER — Encounter: Payer: Self-pay | Admitting: Internal Medicine

## 2022-03-23 ENCOUNTER — Ambulatory Visit: Payer: Medicare Other | Attending: Internal Medicine | Admitting: Internal Medicine

## 2022-03-23 VITALS — BP 100/58 | HR 84 | Ht 62.5 in | Wt 100.2 lb

## 2022-03-23 DIAGNOSIS — R55 Syncope and collapse: Secondary | ICD-10-CM | POA: Diagnosis not present

## 2022-03-23 NOTE — Patient Instructions (Signed)

## 2022-03-24 NOTE — Progress Notes (Signed)
Cardiology Office Note  Date: 03/24/2022   ID: Jenna Rasmussen, DOB 15-Aug-1945, MRN [id number]  PCP:  [facility]  Cardiologist:  Chalmers Guest, MD Electrophysiologist:  None   Reason for Office Visit: Evaluation of presyncope at the request of PCP, [facility]  History of Present Illness: Jenna Rasmussen is a 76 y.o. female known to have GERD, CLL in remission was referred to cardiology clinic for evaluation of presyncope at the request of PCP, [facility]. Accompanied by the daughter-in-law.  Patient had syncopal event in 8/23 and presyncopal event in 10/23 associated with rigidity and jerking movements of her upper extremities. Patient had event monitor in 9/23 and 11/23 which was unremarkable. Neurology evaluated the patient and her EEG was found to be normal. She presented today for follow-up visit. She did not have any further recurrent presyncopal/syncopal episodes. Daughter-in-law believes that she may not be eating as much as food she needs to, including protein shakes. Patient stated she has not been feeling the same since she had COVID 1 year ago. Repeat monitor was requested in 11/23 as she wanted to resume her timolol drops. No evidence of pauses or conduction abnormalities on the ZIO monitor from 11/23. Patient did not want to undergo tilt table testing as it will be done in [place] and not anywhere near by her residence.  She was also evaluated by neurology and EEG was performed it was found to be normal.  They scheduled her for 3-day EEG.  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     alendronate (FOSAMAX) 70 MG tablet Take 70 mg by mouth once a week.     aspirin 81 MG chewable tablet Chew 81 mg by mouth daily.     atorvastatin (LIPITOR) 40 MG tablet Take 40 mg by mouth daily.     b complex vitamins capsule Take 1 capsule by mouth daily.     budesonide-formoterol (SYMBICORT) 160-4.5 MCG/ACT inhaler Inhale 2 puffs into the lungs 2 (two) times daily. 1 each 5   calcium-vitamin D (OSCAL WITH D) 500-200 MG-UNIT tablet Take 1 tablet by mouth daily.     co-enzyme Q-10 30 MG capsule Take by mouth.     dorzolamide-timolol (COSOPT) 2-0.5 % ophthalmic solution Place 1 drop into both eyes 2 (two) times daily.     ibuprofen (ADVIL) 200 MG tablet Take 200 mg by mouth every 8 (eight) hours as needed.     latanoprost (XALATAN) 0.005 % ophthalmic solution Place 1 drop into both eyes at bedtime.     levofloxacin (LEVAQUIN) 750 MG tablet Take 750 mg by mouth daily.     meclizine (ANTIVERT) 25 MG tablet Take 25 mg by mouth 3 (three) times daily as needed for dizziness.     montelukast (SINGULAIR) 10 MG tablet Take 10 mg by mouth daily.     pantoprazole (PROTONIX) 40 MG  tablet Take 40 mg by mouth daily.     No current facility-administered medications for this visit.   Allergies:  Rifampin, Codeine, and Flagyl [metronidazole hcl]   Social History: The patient  reports that she quit smoking about 38 years ago. Her smoking use included cigarettes. She has never used smokeless tobacco. She reports that she does not drink alcohol and does not use drugs.   Family History: The patient's family history includes Heart attack in her father; Heart disease in her father; Hyperlipidemia in her father.   ROS:  Please see the history of present illness.  Otherwise, complete review of systems is positive for none.  All other systems are reviewed and negative.   Physical Exam: VS:  BP (!) 100/58   Pulse 84   Ht 5' 2.5" (1.588 m)   Wt 100 lb 3.2 oz (45.5 kg)   LMP  (LMP Unknown)   SpO2 96%   BMI 18.03 kg/m , BMI Body mass index is 18.03 kg/m.  Wt Readings from Last 3 Encounters:  03/23/22 100 lb 3.2 oz (45.5 kg)  02/16/22 102 lb 6.4 oz (46.4 kg)  02/11/22 100 lb 6.4 oz (45.5 kg)    General: Patient appears comfortable at rest. HEENT: Conjunctiva and lids normal, oropharynx clear with moist mucosa. Neck: Supple, no elevated JVP or carotid bruits, no thyromegaly. Lungs: Clear to auscultation, nonlabored breathing at rest. Cardiac: Regular rate and rhythm, no S3 or significant systolic murmur, no pericardial rub. Abdomen: Soft, nontender, no hepatomegaly, bowel sounds present, no guarding or rebound. Extremities: No pitting edema, distal pulses 2+. Skin: Warm and dry. Musculoskeletal: No kyphosis. Neuropsychiatric: Alert and oriented x3, affect grossly appropriate.  ECG:  An ECG dated 02/11/2022 was personally reviewed today and demonstrated:  Normal sinus rhythm and no ST-T changes  Recent Labwork: No results found for requested labs within last 365 days.  No results found for: "CHOL", "TRIG", "HDL", "CHOLHDL", "VLDL", "LDLCALC", "LDLDIRECT"  Other Studies Reviewed Today: Echo in August 2023 Summary   1. The left ventricular systolic function is normal, LVEF is visually  estimated at 60-65%.    2. The right ventricle is normal in size, with normal systolic function.    3. IVC size and inspiratory change suggest mildly elevated right atrial  pressure. (5-10 mmHg).    4. There are no significant valvular abnormalities.    ZIO monitor in November 2023 Normal sinus rhythm with no evidence of pauses or malignant atrial/ventricular arrhythmias. No patient triggered events.   ZIO monitor in September 2023 Patient had a min HR of  53 bpm, max HR of 184 bpm, and avg HR of 78 bpm. Predominant underlying rhythm was Sinus Rhythm. 2 Ventricular Tachycardia runs occurred, the run with the fastest interval lasting 4 beats with a max rate of 184 bpm, the longest lasting 4 beats with an avg rate of 123 bpm. 26 Supraventricular Tachycardia runs occurred, the run with the fastest interval lasting 5 beats with a max rate of 184 bpm, the longest lasting 17 beats with an avg rate of 136 bpm. Some episodes of Supraventricular Tachycardia may be possible Atrial Tachycardia with variable block. Isolated SVEs were rare (<1.0%), SVE Couplets were rare (<1.0%), and SVE Triplets were rare (<1.0%). Isolated VEs were occasional (1.8%, 27409), VE Couplets were rare (<1.0%, 407), and VE Triplets were rare (<1.0%, 4). Ventricular Bigeminy and Trigeminy were present. MD notification criteria for Ventricular Tachycardia met - report posted prior to notification per account request   ZIO  monitor in 2020 Zio patch reviewed.  12 days and 5 hours analyzed.  Sinus rhythm is present throughout.  Heart rate ranged from 51 bpm up to 113 bpm with average heart rate 76 bpm.  Rare PACs and PVCs were noted representing less than 1% of total beats.  There were rare episodes of SVT, longest of which lasted 11 beats.  No sustained arrhythmias or pauses.   Assessment and Plan: Patient is a 76 year old F known to have GERD, CLL in remission was referred to cardiology for evaluation of presyncope/syncope.  #Syncope #Presyncope Plan -Patient had syncopal event in 8/23 and presyncopal event in 10/23 associated with rigidity and jerking movements of her upper extremities. Patient had event monitor in 9/23 and 11/23 which was unremarkable. Neurology evaluated the patient and her EEG was found to be normal. She did not have further recurrent presyncopal/syncopal episodes after she saw me in 11/23. Will defer tilt table testing (due to patient request) until further episodes. At  that time, she probably will need EP referral for possible loop recorder placement. -Counseled on adequate p.o. intake of fluids/nutrition. -No driving for 6 months or until the etiology is found.   I have spent a total of 22 minutes with patient reviewing chart , telemetry, EKGs, labs and examining patient as well as establishing an assessment and plan that was discussed with the patient.  > 50% of time was spent in direct patient care.     Medication Adjustments/Labs and Tests Ordered: Current medicines are reviewed at length with the patient today.  Concerns regarding medicines are outlined above.   Tests Ordered: No orders of the defined types were placed in this encounter.   Medication Changes: No orders of the defined types were placed in this encounter.   Disposition:  Follow up  6 months  Signed Abie Cheek Fidel Levy, MD, 03/24/2022 8:28 AM    [facility] [place], [place], [place] [address]

## 2022-03-25 ENCOUNTER — Ambulatory Visit: Payer: Medicare Other | Admitting: Internal Medicine

## 2022-03-25 ENCOUNTER — Other Ambulatory Visit: Payer: Medicare Other

## 2022-04-28 ENCOUNTER — Telehealth: Payer: Self-pay | Admitting: Emergency Medicine

## 2022-04-28 DIAGNOSIS — J479 Bronchiectasis, uncomplicated: Secondary | ICD-10-CM

## 2022-04-28 MED ORDER — BENZONATATE 200 MG PO CAPS: 200.0000 mg | ORAL_CAPSULE | Freq: Three times a day (TID) | ORAL | 1 refills | Status: DC | PRN

## 2022-04-28 MED ORDER — PROMETHAZINE-DM 6.25-15 MG/5ML PO SYRP: 5.0000 mL | ORAL_SOLUTION | Freq: Four times a day (QID) | ORAL | 0 refills | Status: DC | PRN

## 2022-04-28 MED ORDER — AMOXICILLIN-POT CLAVULANATE 875-125 MG PO TABS: 1.0000 | ORAL_TABLET | Freq: Two times a day (BID) | ORAL | 0 refills | Status: DC

## 2022-04-28 NOTE — Telephone Encounter (Signed)
Possible bronchiectasis flare with possible bronchitis. She really needs a repeat sputum culture prior to starting further abx therapy. Can she or someone pick up a sample cup from the [place] office and return tomorrow if she can't collect if while she's there? We can send augmentin in - 875 mg Twice daily for 7 days. Take with food. Take daily probiotic or eat yogurt while taking. Use nebulizer 4 times a day until symptoms are better. Since she's having some chest discomfort from coughing persistently, ok to send in benzonatate 200 mg 1 capsule Three times a day as needed for coughing and promethazine DM 5 mL every 6 hours as needed for severe coughing. Caution her to not drive after taking the cough syrup and use caution as it may cause drowsiness and increase risk for falls. Ed precautions.

## 2022-04-28 NOTE — Telephone Encounter (Signed)
Called and spoke with pt letting her know recs per [facility] and she verbalized understanding. Meds have been sent to pharmacy for pt. Orders placed for sputum samples. Sent Meagan a message at [place] office to have her put sputum cups labeled x2 with instructions for pt. Nothing further needed.

## 2022-04-28 NOTE — Telephone Encounter (Signed)
Called and spoke with pt who states she has not been feeling well for a while now (couldn't provide me an exact timeframe). States she is now coughing up green phlegm and also has some chest discomfort due to all the coughing she has been doing.  Pt denies any complaints of wheezing and no fever.  Pt has tried robitussin cough plus chest congestion which has not helped much. Pt has been using her nebulizer 1-2 times a day and also has been using  her rescue inhaler at least twice a day. Pt also has been using her Symbicort inhaler and all other meds as prescribed.  Pt does have an appt scheduled 1/26 but wants to know if something could be prescribed to hold her over until her appt.  Katie, please advise.

## 2022-04-29 ENCOUNTER — Telehealth: Payer: Self-pay | Admitting: Adult Health

## 2022-04-29 NOTE — Telephone Encounter (Signed)
Spoke with patients daughter in [employer]. She states patient didn't think she could produce a sputum sample today and was wondering if she could do it Monday and take it to [facility] then. I advised that should be a problem and they can alwys call and speak to there lab with any questions. Patient was hoping to do the sample at night and refrigerate till Monday I advised sample is only good for 4 hours. She verbalized understanding. Nothing further needed.

## 2022-05-02 ENCOUNTER — Telehealth: Payer: Self-pay | Admitting: Adult Health

## 2022-05-02 ENCOUNTER — Telehealth: Payer: Self-pay | Admitting: Emergency Medicine

## 2022-05-02 NOTE — Telephone Encounter (Signed)
Pls call w/directions on Sputum sample:  AM? PM? OK After nebulizer treatment? Good [phone]

## 2022-05-02 NOTE — Telephone Encounter (Signed)
Spoke with the pt's daughter in law ok per DPR  She states that pt has questions about sputum specimen  I called and spoke with the pt  She states that she is not having any luck coughing out anything  I advised will discuss further at her visit she has scheduled with TP for 05/06/22 She verbalized understanding  Nothing further needed

## 2022-05-02 NOTE — Telephone Encounter (Signed)
duplicate

## 2022-05-06 ENCOUNTER — Encounter: Payer: Self-pay | Admitting: Adult Health

## 2022-05-06 ENCOUNTER — Ambulatory Visit (INDEPENDENT_AMBULATORY_CARE_PROVIDER_SITE_OTHER): Payer: Medicare Other

## 2022-05-06 ENCOUNTER — Ambulatory Visit (INDEPENDENT_AMBULATORY_CARE_PROVIDER_SITE_OTHER): Payer: Medicare Other | Admitting: Adult Health

## 2022-05-06 ENCOUNTER — Telehealth: Payer: Self-pay | Admitting: Adult Health

## 2022-05-06 VITALS — BP 100/60 | HR 71 | Ht 62.0 in | Wt 97.8 lb

## 2022-05-06 DIAGNOSIS — J471 Bronchiectasis with (acute) exacerbation: Secondary | ICD-10-CM

## 2022-05-06 DIAGNOSIS — J479 Bronchiectasis, uncomplicated: Secondary | ICD-10-CM

## 2022-05-06 MED ORDER — ARFORMOTEROL TARTRATE 15 MCG/2ML IN NEBU: 15.0000 ug | INHALATION_SOLUTION | Freq: Two times a day (BID) | RESPIRATORY_TRACT | 6 refills | Status: DC

## 2022-05-06 NOTE — Patient Instructions (Addendum)
Begin Augmentin as directed  Robitussin  1 tsp every 4hr as needed for congestion  Add Flutter valve Twice daily   Begin Brovana Neb Twice daily  (make sure pharmacy files on Medicare B part)  Albuterol neb As needed   Chest xray today  Follow up with Dr. Lamonte Sakai  in 4-6 weeks and As needed   Please contact office for sooner follow up if symptoms do not improve or worsen or seek emergency care

## 2022-05-06 NOTE — Telephone Encounter (Signed)
[facility] calling about the RX for arformoterol (BROVANA) 15 MCG/2ML NEBU [[id number]] States they need a whole new RX w/ Diag code please.

## 2022-05-06 NOTE — Telephone Encounter (Signed)
Spoke with pharmacy they state new rx and diagnosis code is correct. They are going to call patient and let her know. Nothing further needed.

## 2022-05-06 NOTE — Assessment & Plan Note (Signed)
Acute bronchiectatic exacerbation.  History of MAI.  Patient is unfortunately unable to give a sputum sample. Patient has had multiple flares over the last year.  Check chest x-ray today.  Will have her complete course of antibiotics with Augmentin.  Increase her mucociliary clearance.  Add in flutter valve.  Add in LABA nebulizer. Hold on on ICS at this time.  Patient is very thin and frail.  Hold off on vest therapy at this time.  Plan  Patient Instructions  Begin Augmentin as directed  Robitussin  1 tsp every 4hr as needed for congestion  Add Flutter valve Twice daily   Begin Brovana Neb Twice daily  (make sure pharmacy files on Medicare B part)  Albuterol neb As needed   Chest xray today  Follow up with Dr. Lamonte Sakai  in 4-6 weeks and As needed   Please contact office for sooner follow up if symptoms do not improve or worsen or seek emergency care

## 2022-05-06 NOTE — Progress Notes (Signed)
$'@Patient'l$  ID: Jenna Rasmussen, female    DOB: 1945/09/17, 77 y.o.   MRN: [id number]  Chief Complaint  Patient presents with   Follow-up    Cough    Referring provider: [facility], [facility]  HPI: 77 year old female former smoker seen for pulmonary consult March 2023 to establish for longstanding bronchiectasis. Medical history significant for CLL in remission Treated in the past for [facility] (2007), also colonized with Acinetobacter, MSSA, stenotrophomonas.    TEST/EVENTS :  Sputum: AFB 06/19/2021 >> negative, bacterial culture normal flora   Pulmonary function testing 07/23/21 reviewed by me show moderately severe obstruction without a bronchodilator response, hyperinflated volumes, decreased diffusion capacity that does not fully correct for her alveolar volume.   05/06/2022 Acute OV : Cough , bronchiectasis Patient presents for an acute office visit.  Complains over the last week that she has had increased cough, congestion.  She was called in Augmentin on April 29, 2022.  But she has not started this.  Was supposed to leave a sputum culture but was unable to give sputum sample. She was previously on Symbicort but her insurance will not cover this.  Currently using albuterol nebulizer twice daily.  Patient does not have a flutter valve.  Patient denies any hemoptysis, chest pain, orthopnea.  Weight has slowly been going down over the last couple years.  Currently at 97 pounds.  Patient says she does try to eat but just does not have much appetite.  She denies any nausea vomiting or diarrhea.  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     budesonide-formoterol (SYMBICORT) 160-4.5 MCG/ACT inhaler Inhale 2 puffs into the lungs 2 (two) times daily. 1 each 5   montelukast (SINGULAIR) 10 MG tablet Take 10 mg by mouth daily.     alendronate (FOSAMAX) 70 MG tablet Take 70 mg by mouth once a week.     amoxicillin-clavulanate (AUGMENTIN) 875-125 MG tablet Take 1 tablet by mouth 2 (two) times daily. (Patient not taking: Reported on 05/06/2022) 14 tablet 0   aspirin 81 MG chewable tablet Chew 81 mg by mouth daily.     atorvastatin (LIPITOR) 40 MG tablet Take 40 mg by mouth daily.     b complex vitamins capsule Take 1 capsule by mouth daily.     benzonatate (TESSALON) 200 MG capsule Take 1 capsule (200 mg total) by mouth 3 (three) times daily as needed for cough. (Patient not taking: Reported on 05/06/2022) 30 capsule 1   calcium-vitamin  D (OSCAL WITH D) 500-200 MG-UNIT tablet Take 1 tablet by mouth daily.     co-enzyme Q-10 30 MG capsule Take by mouth.     dorzolamide-timolol (COSOPT) 2-0.5 % ophthalmic solution Place 1 drop into both eyes 2 (two) times daily.     ibuprofen (ADVIL) 200 MG tablet Take 200 mg by mouth every 8 (eight) hours as needed.     latanoprost (XALATAN) 0.005 % ophthalmic solution Place 1 drop into both eyes at bedtime.     meclizine (ANTIVERT) 25 MG tablet Take 25 mg by mouth 3 (three) times daily as needed for dizziness.      pantoprazole (PROTONIX) 40 MG tablet Take 40 mg by mouth daily.     promethazine-dextromethorphan (PROMETHAZINE-DM) 6.25-15 MG/5ML syrup Take 5 mLs by mouth every 6 (six) hours as needed for cough. (Patient not taking: Reported on 05/06/2022) 118 mL 0   No facility-administered medications prior to visit.     Review of Systems:   Constitutional:   No night sweats,  Fevers, chills, fatigue, or  lassitude.  HEENT:   No headaches,  Difficulty swallowing,  Tooth/dental problems, or  Sore throat,                No sneezing, itching, ear ache, nasal congestion, post nasal drip,   CV:  No chest pain,  Orthopnea, PND, swelling in lower extremities, anasarca, dizziness, palpitations, syncope.   GI  No heartburn, indigestion, abdominal pain, nausea, vomiting, diarrhea, change in bowel habits, loss of appetite, bloody stools.   Resp:.  No chest wall deformity  Skin: no rash or lesions.  GU: no dysuria, change in color of urine, no urgency or frequency.  No flank pain, no hematuria   MS:  No joint pain or swelling.  No decreased range of motion.  No back pain.    Physical Exam  BP 100/60 (BP Location: Left Arm)   Pulse 71   Ht '5\' 2"'$  (1.575 m)   Wt 97 lb 12.8 oz (44.4 kg)   LMP  (LMP Unknown)   SpO2 94%   BMI 17.89 kg/m   GEN: A/Ox3; pleasant , NAD, thin and frail female   HEENT:  [place]/AT,  EACs-clear, TMs-wnl, NOSE-clear, THROAT-clear, no lesions, no postnasal drip or exudate noted.   NECK:  Supple w/ fair ROM; no JVD; normal carotid impulses w/o bruits; no thyromegaly or nodules palpated; no lymphadenopathy.    RESP scattered rhonchi, I. no accessory muscle use, no dullness to percussion  CARD:  RRR, no m/r/g, no peripheral edema, pulses intact, no cyanosis or clubbing.  GI:   Soft & nt; nml bowel sounds; no organomegaly or masses detected.   Musco: Warm bil, no deformities or joint swelling noted.   Neuro: alert, no focal deficits noted.    Skin: Warm, no lesions or  rashes    Lab Results:  CBC No results found for: "WBC", "RBC", "HGB", "HCT", "PLT", "MCV", "MCH", "MCHC", "RDW", "LYMPHSABS", "MONOABS", "EOSABS", "BASOSABS"  BMET  No results found for: "BNP"  ProBNP No results found for: "PROBNP"  Imaging: DG Chest 2 View  Result Date: 05/06/2022 CLINICAL DATA:  Bronchiectasis EXAM: CHEST - 2 VIEW COMPARISON:  02/03/2022, 03/02/2021 FINDINGS: The heart size and mediastinal contours are within normal limits. Chronically hyperinflated lungs with coarsened interstitial markings. Increased reticulonodular opacity within the left lower lobe. No pleural effusion or pneumothorax. Exaggerated thoracic kyphosis. IMPRESSION: Increased reticulonodular opacity within the left lower lobe, suspicious for developing pneumonia. Electronically Signed   By:  Nicholas  Plundo D.O.   On: 05/06/2022 11:15         Latest Ref Rng & Units 07/23/2021   10:06 AM  PFT Results  FVC-Pre L 2.31   FVC-Predicted Pre % 82   FVC-Post L 2.29   FVC-Predicted Post % 81   Pre FEV1/FVC % % 57   Post FEV1/FCV % % 51   FEV1-Pre L 1.31   FEV1-Predicted Pre % 62   FEV1-Post L 1.17   DLCO uncorrected ml/min/mmHg 9.99   DLCO UNC% % 52   DLCO corrected ml/min/mmHg 9.99   DLCO COR %Predicted % 52   DLVA Predicted % 80   TLC L 5.50   TLC % Predicted % 108   RV % Predicted % 142     No results found for: "NITRICOXIDE"      Assessment & Plan:   Bronchiectasis (HCC) Acute bronchiectatic exacerbation.  History of MAI.  Patient is unfortunately unable to give a sputum sample. Patient has had multiple flares over the last year.  Check chest x-ray today.  Will have her complete course of antibiotics with Augmentin.  Increase her mucociliary clearance.  Add in flutter valve.  Add in LABA nebulizer. Hold on on ICS at this time.  Patient is very thin and frail.  Hold off on vest therapy at this time.  Plan  Patient Instructions  Begin Augmentin as directed  Robitussin  1 tsp  every 4hr as needed for congestion  Add Flutter valve Twice daily   Begin Brovana Neb Twice daily  (make sure pharmacy files on Medicare B part)  Albuterol neb As needed   Chest xray today  Follow up with Dr. Lamonte Sakai  in 4-6 weeks and As needed   Please contact office for sooner follow up if symptoms do not improve or worsen or seek emergency care        Rexene Edison, NP 05/06/2022

## 2022-05-06 NOTE — Telephone Encounter (Signed)
Done see if this works

## 2022-05-16 ENCOUNTER — Ambulatory Visit: Payer: Medicare Other | Admitting: Neurology

## 2022-05-20 ENCOUNTER — Telehealth: Payer: Self-pay | Admitting: Adult Health

## 2022-05-20 DIAGNOSIS — J479 Bronchiectasis, uncomplicated: Secondary | ICD-10-CM

## 2022-05-20 NOTE — Telephone Encounter (Signed)
Spoke with patient. Wanted to go over chest xray results states she had forgot. Patient states she has finished antibiotic. Went over appointment date, states she would have a chest xray done that day as well. Order for xray has been placed. Nothing further needed. At this time.

## 2022-05-26 ENCOUNTER — Encounter: Payer: Self-pay | Admitting: Neurology

## 2022-05-26 ENCOUNTER — Ambulatory Visit (INDEPENDENT_AMBULATORY_CARE_PROVIDER_SITE_OTHER): Payer: Medicare Other | Admitting: Neurology

## 2022-05-26 VITALS — BP 107/65 | HR 73 | Ht 62.0 in | Wt 98.6 lb

## 2022-05-26 DIAGNOSIS — R55 Syncope and collapse: Secondary | ICD-10-CM

## 2022-05-26 DIAGNOSIS — G3184 Mild cognitive impairment, so stated: Secondary | ICD-10-CM | POA: Diagnosis not present

## 2022-05-26 NOTE — Progress Notes (Signed)
NEUROLOGY FOLLOW UP OFFICE NOTE  [name] VC:[id number] 08/18/1945  HISTORY OF PRESENT ILLNESS: I had the pleasure of seeing Jenna Rasmussen in follow-up in the neurology clinic on 06/05/2022.  The patient was last seen 3 months ago for syncope and memory loss. She is again accompanied by her daughter-in-law Jenna Rasmussen who help supplement the history today.  Records and images were personally reviewed where available.  Brain MRI in 11/2021 showed moderately severe chronic microvascular disease, no acute changes. Her EEG in 02/2022 was normal. We had discussed doing an ambulatory EEG however she did not want to proceed due to concerns about having to bring the camera around. They deny any further syncopal episodes since 01/2022. No staring/unresponsive episodes, gaps in time, olfactory/gustatory hallucinations, focal weakness, myoclonic jerks. After the visit, they asked nurse to relate that she had brief tingling on the left side of her face last Sunday, no recurrence. She feels memory is unchanged. Jenna Rasmussen notes that sometimes when they asked if she took her medication, they are not sure if she did or not. She would not recall conversations with the pharmacist or doctor. She has not been driving.   History on Initial Assessment 02/16/2022: This is a pleasant 77 year old right-handed woman with a history of hyperlipidemia, CLL in remission, presenting for evaluation of syncope and memory loss. They report that after she had Covid in 03/2021, she has been complaining of generally feeling unwell since. She would say "I can feel it in my head." Previously, she had lost her husband in June 2021 and had episodes of vertigo affecting daily activities, spending her day in bed due to anxiety of having a fall, then she had endolymphatic surgery in 11/2020 and the vertigo resolved completely. She however has had underlying anxiety/worry when she got Covid and had seen Pulmonary thinking she had an infection but did not feel  any better. On 11/29/21, she was a passenger in the car and told her daughter she was not feeling well, asking to be taken home and that she needed to go to the bathroom. She then passed out and woke up to EMS around her. Her daughter reported her head went back, eyes rolled back and arms flexed, she was rigid and had shaking, making a very vocal sound ("gurgling moan"), then she was just moaning after. This lasted less than 3 minutes, she was waking up as EMS opened her door. No tongue bite or incontinence. She does nor recall feeling dizzy, no headache, focal numbness/tingling/weakness. She was very sleepy when she got to the hospital, she was nauseated and vomited in the hospital. She was admitted overnight at [facility] where brain MRI without contrast showed chronic microvascular changes moderately severe in the cerebral white matter, progressed from prior MRI done 04/2020. 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 There was mild generalized cerebral atrophy. Carotid dopplers no significant stenosis, echocardiogram was normal. Zio patch did not show any sustained arrhythmia or prolonged pauses, occasional PVCs and short SV/V runs were present. She was seen by Cardiology in follow-up. On 02/03/22, she had another syncopal episode. She was with her realtor looking at a house, she recalls coming out and felt like she missed a step. She said she would just sit for a minute, then fel like she was getting lightheaded with the world fading. The realtor reported she was stepping down steps and sat down hard on the ground. Realtor helped her  lean against the wall and asked her questions that she was able to answer. Her realtor went in to get a rag and could tell she was going to fall over so she sat next to her as she briefly passed out with a little bit of jerking. She threw up when EMS arrived and faded out again with EMS, saying she wanted to go  home. She was pale with note of HR was in the upper 30s/low 40s. In the ER, HR was in the 50s. She was seen by a different cardiologist and a tilt table test has been ordered. No further syncopal episodes since then.  Family has also been concerned about her memory. They started noticing some confusion, repeating herself, since her husband passed away in 07-09-19. At that time, she was taking Xanax. It was stopped and symptoms got better but did not go away completely. She lives alone. She denies missing medications or bill payments. Jenna Rasmussen notes though that she could not tell family exactly what medication she took or when she took it. She was previously driving and denied getting lost driving. She denies leaving the stove on. Her paternal grandmother and several aunts had dementia. In January 2023, Jenna Rasmussen recalls a strange episode. As previously mentioned, she had been feeling bad since her Covid infection the year prior, they were at a family gathering when she became very distant/removed, not answering when people asked questions but looking at them. She told them she was not feeling well with a headache, got up, then lay on the floor of her bedroom, which is unusual. She went to sleep on the floor for 30-45 minutes. Family has not noticed any staring/unresponsive episodes. She denies any other gaps in time. She denies any olfactory/gustatory hallucinations, deja vu, rising epigastric sensation, focal numbness/tingling/weakness, myoclonic jerks. Prior to her Covid infection, she did not have a history of headaches. Since then, she has had headaches or complains of her head not feeling right 5 out of 7 days in a week. She takes Tylenol which does not help, weather changes seem to affect her. She states something "feels heavy and does not feel normal." She describes a pressure in her head, she can still do her daily activities but family has noticed she does not do her chores as often, or making bread/going to  grocery, like before. She denies any dizziness, diplopia, dysarthria/dysphagia, neck/back pain, bowel/bladder dysfunction, anosmia, or tremors. Her nephew has seizures. She had a normal birth and early development.  There is no history of febrile convulsions, CNS infections such as meningitis/encephalitis, significant traumatic brain injury, neurosurgical procedures.  Bloodwork at PCP office in 12/2021 showed an LDL of 58, total cholesterol of 137. Bloodwork in 05/2021 showed a normal B12 of 381, normal TSH.    PAST MEDICAL HISTORY: Past Medical History:  Diagnosis Date   Chronic lymphocytic leukemia of B-cell type in remission (HCC)    GERD (gastroesophageal reflux disease)    Hyperlipidemia    Osteoarthritis     MEDICATIONS: Current Outpatient Medications on File Prior to Visit  Medication Sig Dispense Refill   albuterol (PROVENTIL) (2.5 MG/3ML) 0.083% nebulizer solution Take 2.5 mg by nebulization 2 (two) times daily.     alendronate (FOSAMAX) 70 MG tablet Take 70 mg by mouth once a week.     amoxicillin-clavulanate (AUGMENTIN) 875-125 MG tablet Take 1 tablet by mouth 2 (two) times daily. (Patient not taking: Reported on 05/06/2022) 14 tablet 0   arformoterol (BROVANA) 15 MCG/2ML NEBU Take 2  mLs (15 mcg total) by nebulization 2 (two) times daily. 120 mL 6   aspirin 81 MG chewable tablet Chew 81 mg by mouth daily.     atorvastatin (LIPITOR) 40 MG tablet Take 40 mg by mouth daily.     b complex vitamins capsule Take 1 capsule by mouth daily.     benzonatate (TESSALON) 200 MG capsule Take 1 capsule (200 mg total) by mouth 3 (three) times daily as needed for cough. (Patient not taking: Reported on 05/06/2022) 30 capsule 1   calcium-vitamin D (OSCAL WITH D) 500-200 MG-UNIT tablet Take 1 tablet by mouth daily.     co-enzyme Q-10 30 MG capsule Take by mouth.     dorzolamide-timolol (COSOPT) 2-0.5 % ophthalmic solution Place 1 drop into both eyes 2 (two) times daily.     ibuprofen (ADVIL) 200 MG  tablet Take 200 mg by mouth every 8 (eight) hours as needed.     latanoprost (XALATAN) 0.005 % ophthalmic solution Place 1 drop into both eyes at bedtime.     meclizine (ANTIVERT) 25 MG tablet Take 25 mg by mouth 3 (three) times daily as needed for dizziness.     montelukast (SINGULAIR) 10 MG tablet Take 10 mg by mouth daily.     pantoprazole (PROTONIX) 40 MG tablet Take 40 mg by mouth daily.     promethazine-dextromethorphan (PROMETHAZINE-DM) 6.25-15 MG/5ML syrup Take 5 mLs by mouth every 6 (six) hours as needed for cough. (Patient not taking: Reported on 05/06/2022) 118 mL 0   No current facility-administered medications on file prior to visit.    ALLERGIES: Allergies  Allergen Reactions   Rifampin     Liver problems and patient ended up in hospital   Codeine     Dizziness stomach upset   Flagyl [Metronidazole Hcl]     rash    FAMILY HISTORY: Family History  Problem Relation Age of Onset   Heart disease Father    Heart attack Father    Hyperlipidemia Father     SOCIAL HISTORY: Social History   Socioeconomic History   Marital status: Widowed    Spouse name: Not on file   Number of children: Not on file   Years of education: Not on file   Highest education level: Not on file  Occupational History   Not on file  Tobacco Use   Smoking status: Former    Types: Cigarettes    Quit date: 04/12/1983    Years since quitting: 39.1   Smokeless tobacco: Never   Tobacco comments:    smoked x 20 yrs  Vaping Use   Vaping Use: Never used  Substance and Sexual Activity   Alcohol use: Never   Drug use: Never   Sexual activity: Not on file  Other Topics Concern   Not on file  Social History Narrative   Are you right handed or left handed? Right handed   Are you currently employed ? Retired    What is your current occupation? NA   Do you live at home alone? alone   Who lives with you? NA   What type of home do you live in: 1 story or 2 story? 1 story        Social  Determinants of Radio broadcast assistant Strain: Not on file  Food Insecurity: Not on file  Transportation Needs: Not on file  Physical Activity: Not on file  Stress: Not on file  Social Connections: Not on file  Intimate Partner Violence: Not  on file     PHYSICAL EXAM: Vitals:   05/26/22 1139  BP: 107/65  Pulse: 73  SpO2: 99%   General: No acute distress Head:  Normocephalic/atraumatic Skin/Extremities: No rash, no edema Neurological Exam: alert and awake. No aphasia or dysarthria. Fund of knowledge is appropriate.  Attention and concentration are normal.   Cranial nerves: Pupils equal, round. Extraocular movements intact.  No facial asymmetry.  Motor: moves all extremities symmetrically at least anti-gravity x 4. Gait narrow-based and steady, no ataxia.   IMPRESSION: This is a pleasant 77 yo RH woman with a history of hyperlipidemia, CLL in remission, with syncope and memory loss. She had 2 episodes of loss of consciousness that occurred 11/2021 and 01/2022 with report of stiffening and shaking with the first episode, bradycardia. MRI brain no acute changes, there was moderate chronic microvascular disease and note of a lacunar infarct that was not seen on imaging from a year ago. She has had an extensive cardiac workup, no arrhythmia seen on holter. Her routine EEG is normal. We discussed the ambulatory EEG, she is concerned about the camera, and we discussed it can be done without video. She would like to hold off for now since she has not had any incidents since 01/2022. We discussed memory concerns, MoCA 18/30 in 02/2022, Neuropsychological evaluation will be ordered to further evaluate. She is aware of [place] driving laws to stop driving after an episode of loss of consciousness until 6 months event-free. Follow-up in 6 months, call for any changes.    Thank you for allowing me to participate in her care.  Please do not hesitate to call for any questions or concerns.    Ellouise Newer, M.D.   CC: [place]

## 2022-05-26 NOTE — Patient Instructions (Signed)
Good to see you.  Schedule Neurocognitive testing  2. Continue to monitor symptoms, call for any changes  3. Follow-up in 6 months, call for any changes   You have been referred for a neurocognitive evaluation in our office.   The evaluation has two parts.   The first part of the evaluation is a clinical interview with the neuropsychologist (Dr. Melvyn Novas or Dr. Nicole Kindred). Please bring someone with you to this appointment if possible, as it is helpful for the doctor to hear from both you and another adult who knows you well.   The second part of the evaluation is testing with the doctor's technician Hinton Dyer or Maudie Mercury). The testing includes a variety of tasks- mostly question-and-answer, some paper-and-pencil. There is nothing you need to do to prepare for this appointment, but having a good night's sleep prior to the testing, taking medications as you normally would, and bringing eyeglasses and hearing aids (if you wear them), is advised. Please make sure that you wear a mask to the appointment.  Please note: We have to reserve several hours of the neuropsychologist's time and the psychometrician's time for your evaluation appointment. As such, please note that there is a No-Show fee of $100. If you are unable to attend any of your appointments, please contact our office as soon as possible to reschedule.

## 2022-06-02 ENCOUNTER — Encounter: Payer: Self-pay | Admitting: Neurology

## 2022-06-07 ENCOUNTER — Ambulatory Visit (INDEPENDENT_AMBULATORY_CARE_PROVIDER_SITE_OTHER): Payer: Medicare Other | Admitting: Emergency Medicine

## 2022-06-07 ENCOUNTER — Encounter: Payer: Self-pay | Admitting: Emergency Medicine

## 2022-06-07 VITALS — BP 112/70 | HR 65 | Temp 97.6°F | Ht 62.5 in | Wt 98.6 lb

## 2022-06-07 DIAGNOSIS — J479 Bronchiectasis, uncomplicated: Secondary | ICD-10-CM | POA: Diagnosis not present

## 2022-06-07 MED ORDER — BUDESONIDE-FORMOTEROL FUMARATE 160-4.5 MCG/ACT IN AERO: 2.0000 | INHALATION_SPRAY | Freq: Two times a day (BID) | RESPIRATORY_TRACT | 6 refills | Status: DC

## 2022-06-07 NOTE — Patient Instructions (Addendum)
We will restart budesonide/formoterol 2 puffs twice a day.  (This is generic Symbicort).  Rinse and gargle after you use it. Keep your albuterol available to use either 2 puffs or 1 nebulizer treatment when you need it for shortness of breath, chest tightness, mucus clearance. Continue your flutter valve twice a day Follow with APP in 2 months to assess status on the generic Symbicort Follow with Dr Lamonte Sakai in 6 months or sooner if you have any problems

## 2022-06-07 NOTE — Assessment & Plan Note (Signed)
She was unable to get the [facility], still feels exertional shortness of breath.  Temporary relief when she was on the Augmentin.  She is using albuterol nebs twice a day.  I believe she does need to be back on scheduled BD therapy.  She has benefited in the past from Symbicort but cost prohibitive.  We can try generic Symbicort and see if this is covered by her insurance.  If not then we will do a test case to determine what is most affordable.  Continue her flutter valve.  Continue albuterol as needed for shortness of breath and also secretion clearance.

## 2022-06-07 NOTE — Progress Notes (Signed)
Subjective:    Patient ID: Jenna Rasmussen, female    DOB: 01/05/1946, 77 y.o.   MRN: [id number]   HPI  [name] 10/19/21 --Jenna Rasmussen is 86 and has a history of former tobacco (5-10 pack years), CLL (in remission), longstanding bronchiectasis with MAIC.  Also with positive cultures in the past for MSSA, stenotrophomonas and Acinetobacter.  Her most recent sputum cultures were negative for AFB or bacteria 06/19/2021.  In May I had hoped to get her back on scheduled BD therapy, originally started Symbicort but cost barriers and no good formulary substitution noted.  She has been using albuterol every evening. She coughs up mucous, often at night, greenish. She is active, but is not back to her pre-COVID baseline. She uses benadryl prn, loratadine - but not currently on   Chest x-ray performed 09/29/2021 reviewed by me showed some coarse bronchial markings consistent with chronic bronchitic change, no infiltrates  [name] 06/07/2022 --77 year old former smoker with a history of CLL and longstanding bronchiectasis.  She is positive for [facility], also with past positive cultures for MSSA, stenotrophomonas, Acinetobacter.  She was treated for an acute flare of her bronchiectasis with Augmentin 05/06/2022.  At that time Jenna Rasmussen was added to her regimen but unable to get this due to cost and insurance coverage, she started flutter valve twice daily. She is doing albuterol bid.  Today she reports that she improved some on the augmentin - less SOB, cough is about the same.   Chest x-ray 05/06/2022 reviewed by me showed increased reticular nodular opacity in left lower lobe, question involving pneumonia.  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)    GERD (gastroesophageal reflux disease)    Hyperlipidemia    Osteoarthritis      Family History  Problem Relation Age of Onset   Heart disease Father     Heart attack Father    Hyperlipidemia Father      Social History   Socioeconomic History   Marital status: Widowed    Spouse name: Not on file   Number of children: Not on file   Years of education: Not on file   Highest education level: Not on file  Occupational History   Not on file  Tobacco Use   Smoking status: Former    Types: Cigarettes    Quit date: 04/12/1983    Years since quitting: 39.1   Smokeless tobacco: Never   Tobacco comments:    smoked x 20 yrs  Vaping Use   Vaping Use: Never used  Substance and Sexual Activity   Alcohol use: Never   Drug use: Never   Sexual activity: Not on file  Other Topics Concern   Not on file  Social History Narrative   Are you right handed or left handed? Right handed   Are you currently employed ? Retired    What is your current occupation? NA   Do you live at home alone? alone   Who lives with you? NA   What type of home do you live in: 1 story or 2 story? 1 story        Social Determinants of Radio broadcast assistant Strain: Not on file  Food Insecurity: Not on file  Transportation Needs: Not on file  Physical Activity: Not on file  Stress: Not on file  Social Connections: Not on file  Intimate  Partner Violence: Not on file     Allergies  Allergen Reactions   Rifampin     Liver problems and patient ended up in hospital   Codeine     Dizziness stomach upset   Flagyl [Metronidazole Hcl]     rash     Outpatient Medications Prior to Visit  Medication Sig Dispense Refill   albuterol (PROVENTIL) (2.5 MG/3ML) 0.083% nebulizer solution Take 2.5 mg by nebulization 2 (two) times daily.     alendronate (FOSAMAX) 70 MG tablet Take 70 mg by mouth once a week.     aspirin 81 MG chewable tablet Chew 81 mg by mouth daily.     atorvastatin (LIPITOR) 40 MG tablet Take 40 mg by mouth daily.     b complex vitamins capsule Take 1 capsule by mouth daily.     calcium-vitamin D (OSCAL WITH D) 500-200 MG-UNIT tablet Take 1 tablet  by mouth daily.     co-enzyme Q-10 30 MG capsule Take by mouth.     dorzolamide-timolol (COSOPT) 2-0.5 % ophthalmic solution Place 1 drop into both eyes 2 (two) times daily.     ibuprofen (ADVIL) 200 MG tablet Take 200 mg by mouth every 8 (eight) hours as needed.     latanoprost (XALATAN) 0.005 % ophthalmic solution Place 1 drop into both eyes at bedtime.     loratadine (CLARITIN) 5 MG chewable tablet Chew 5 mg by mouth daily.     meclizine (ANTIVERT) 25 MG tablet Take 25 mg by mouth 3 (three) times daily as needed for dizziness.     pantoprazole (PROTONIX) 40 MG tablet Take 40 mg by mouth daily.     arformoterol (BROVANA) 15 MCG/2ML NEBU Take 2 mLs (15 mcg total) by nebulization 2 (two) times daily. (Patient not taking: Reported on 05/26/2022) 120 mL 6   montelukast (SINGULAIR) 10 MG tablet Take 10 mg by mouth daily. (Patient not taking: Reported on 05/26/2022)     promethazine-dextromethorphan (PROMETHAZINE-DM) 6.25-15 MG/5ML syrup Take 5 mLs by mouth every 6 (six) hours as needed for cough. (Patient not taking: Reported on 05/06/2022) 118 mL 0   No facility-administered medications prior to visit.        Objective:   Physical Exam Vitals:   06/07/22 1014  BP: 112/70  Pulse: 65  Temp: 97.6 F (36.4 C)  TempSrc: Oral  SpO2: 96%  Weight: 98 lb 9.6 oz (44.7 kg)  Height: 5' 2.5" (1.588 m)   Gen: Pleasant, well-nourished, in no distress,  normal affect  ENT: No lesions,  mouth clear,  oropharynx clear, no postnasal drip  Neck: No JVD, no stridor  Lungs: No use of accessory muscles, distant.  Occasional cough.  No wheezing  Cardiovascular: RRR, heart sounds normal, no murmur or gallops, no peripheral edema  Musculoskeletal: No deformities, no cyanosis or clubbing  Neuro: alert, awake, non focal  Skin: Warm, no lesions or rash      Assessment & Plan:   Bronchiectasis ([place]) She was unable to get the [facility], still feels exertional shortness of breath.  Temporary relief when  she was on the Augmentin.  She is using albuterol nebs twice a day.  I believe she does need to be back on scheduled BD therapy.  She has benefited in the past from Symbicort but cost prohibitive.  We can try generic Symbicort and see if this is covered by her insurance.  If not then we will do a test case to determine what is most affordable.  Continue  her flutter valve.  Continue albuterol as needed for shortness of breath and also secretion clearance.   Baltazar Apo, MD, PhD 06/07/2022, 10:37 AM [facility] [phone] or if no answer before 7:00PM call [phone] For any issues after 7:00PM please call eLink [phone]

## 2022-06-09 ENCOUNTER — Other Ambulatory Visit (HOSPITAL_COMMUNITY): Payer: Self-pay

## 2022-06-09 NOTE — Telephone Encounter (Signed)
Symbicort is the cheapest alternative at this time. Patient has a deductible to meet which is contributing to the high co-pays at this time.

## 2022-06-15 ENCOUNTER — Telehealth: Payer: Self-pay | Admitting: Emergency Medicine

## 2022-06-15 NOTE — Telephone Encounter (Signed)
[phone] is her best #. She is calling again.

## 2022-06-15 NOTE — Telephone Encounter (Signed)
Pt calling bc she is having trouble w her breathing and coughing  Pharmacy: [facility]

## 2022-06-15 NOTE — Telephone Encounter (Signed)
ATC X1 LVM for patient to call the office back

## 2022-06-16 MED ORDER — LEVOFLOXACIN 500 MG PO TABS: 500.0000 mg | ORAL_TABLET | Freq: Every day | ORAL | 0 refills | Status: DC

## 2022-06-16 NOTE — Telephone Encounter (Signed)
Called and spoke with the pt  She is c/o increased cough x 4 days- prod with minimal green sputum  She also says that at times her chest "feels heavy" Denies any fevers, aches or increased SOB  She is taking her symbicort, albuterol, flutter valve all as directed  Dr Lamonte Sakai, please advise any recs, thanks  Allergies  Allergen Reactions   Rifampin     Liver problems and patient ended up in hospital   Codeine     Dizziness stomach upset   Flagyl [Metronidazole Hcl]     rash

## 2022-06-16 NOTE — Telephone Encounter (Signed)
Spoke with the Jenna Rasmussen and notified of response per Dr Lamonte Sakai  She verbalized understanding  Rx was sent to pharm  Appt with TP for next wk 06/22/22 at 11 am

## 2022-06-16 NOTE — Telephone Encounter (Signed)
PT ret ret call. Please try again. Adv answer all calls if poss. [phone] This is the good # for her today.

## 2022-06-16 NOTE — Telephone Encounter (Signed)
Would ask her to take levaquin '500mg'$  qd x 7 days She needs to be seen next week by APP or RB to insure improving.

## 2022-06-22 ENCOUNTER — Telehealth: Payer: Self-pay | Admitting: Adult Health

## 2022-06-22 ENCOUNTER — Ambulatory Visit (INDEPENDENT_AMBULATORY_CARE_PROVIDER_SITE_OTHER): Payer: Medicare Other

## 2022-06-22 ENCOUNTER — Ambulatory Visit (INDEPENDENT_AMBULATORY_CARE_PROVIDER_SITE_OTHER): Payer: Medicare Other | Admitting: Adult Health

## 2022-06-22 ENCOUNTER — Encounter: Payer: Self-pay | Admitting: Adult Health

## 2022-06-22 VITALS — BP 110/50 | HR 81 | Temp 97.5°F | Ht 62.5 in | Wt 99.0 lb

## 2022-06-22 DIAGNOSIS — J471 Bronchiectasis with (acute) exacerbation: Secondary | ICD-10-CM

## 2022-06-22 DIAGNOSIS — J439 Emphysema, unspecified: Secondary | ICD-10-CM | POA: Diagnosis not present

## 2022-06-22 DIAGNOSIS — J8410 Pulmonary fibrosis, unspecified: Secondary | ICD-10-CM | POA: Diagnosis not present

## 2022-06-22 DIAGNOSIS — J189 Pneumonia, unspecified organism: Secondary | ICD-10-CM | POA: Diagnosis not present

## 2022-06-22 LAB — BASIC METABOLIC PANEL
BUN: 12 mg/dL (ref 6–23)
CO2: 29 mEq/L (ref 19–32)
Calcium: 9 mg/dL (ref 8.4–10.5)
Chloride: 101 mEq/L (ref 96–112)
Creatinine, Ser: 0.9 mg/dL (ref 0.40–1.20)
GFR: 62.2 mL/min (ref >60.00)
Glucose, Bld: 62 mg/dL — ABNORMAL LOW (ref 70–99)
Potassium: 3.3 mEq/L — ABNORMAL LOW (ref 3.5–5.1)
Sodium: 137 mEq/L (ref 135–145)

## 2022-06-22 LAB — CBC WITH DIFFERENTIAL/PLATELET
Basophils Absolute: 0.1 10*3/uL (ref 0.0–0.1)
Basophils Relative: 0.7 % (ref 0.0–3.0)
Eosinophils Absolute: 0.3 10*3/uL (ref 0.0–0.7)
Eosinophils Relative: 2.3 % (ref 0.0–5.0)
HCT: 38.4 % (ref 36.0–46.0)
Hemoglobin: 12.6 g/dL (ref 12.0–15.0)
Lymphocytes Relative: 14 % (ref 12.0–46.0)
Lymphs Abs: 1.9 10*3/uL (ref 0.7–4.0)
MCHC: 32.8 g/dL (ref 30.0–36.0)
MCV: 91.1 fl (ref 78.0–100.0)
Monocytes Absolute: 1.1 10*3/uL — ABNORMAL HIGH (ref 0.1–1.0)
Monocytes Relative: 8.5 % (ref 3.0–12.0)
Neutro Abs: 10 10*3/uL — ABNORMAL HIGH (ref 1.4–7.7)
Neutrophils Relative %: 74.5 % (ref 43.0–77.0)
Platelets: 324 10*3/uL (ref 150.0–400.0)
RBC: 4.22 Mil/uL (ref 3.87–5.11)
RDW: 13.6 % (ref 11.5–15.5)
WBC: 13.4 10*3/uL — ABNORMAL HIGH (ref 4.0–10.5)

## 2022-06-22 NOTE — Telephone Encounter (Signed)
Called and spoke to pt. Pt states she was just informing us with information because she forgot to mention it to Tammy during her office visit today 06/22/22. Will send to Tammy as a [place]

## 2022-06-22 NOTE — Progress Notes (Signed)
States she had either the flu or the pna vaccine, but is not sure, then she got sick.  She states she knows she received the first covid vaccine, but has not received any recently.

## 2022-06-22 NOTE — Telephone Encounter (Signed)
Patient states having pain on side and arm. Patient phone number is [phone].

## 2022-06-22 NOTE — Patient Instructions (Addendum)
Sputum culture and sputum AFB  Set up for HRCT chest.  Continue on Symbicort 2 puffs Twice daily , rinse after use.  Finish Levaquin as directed.  Robitussin  1 tsp every 4hr as needed for congestion  Flutter valve Twice daily   Albuterol neb As needed   Chest xray today  Follow up with Dr. Lamonte Sakai  in 6-8 weeks and As needed   Please contact office for sooner follow up if symptoms do not improve or worsen or seek emergency care

## 2022-06-22 NOTE — Progress Notes (Signed)
@Patient  ID: Jenna Rasmussen, female    DOB: October 09, 1945, 77 y.o.   MRN: [id number]  Chief Complaint  Patient presents with   Follow-up    Referring provider: [facility], [facility]  HPI: 77 year old female former smoker seen for pulmonary consult March 2023 to establish for longstanding bronchiectasis Medical history significant for CLL in remission Treated in the past for [facility] (2007), also colonized with Acinetobacter, MSSA and stenotrophomonas   TEST/EVENTS :  Sputum: AFB 06/19/2021 >> negative, bacterial culture normal flora    Pulmonary function testing 07/23/21 show moderately severe obstruction without a bronchodilator response, hyperinflated volumes, decreased diffusion capacity that does not fully correct for her alveolar volume.  CT chest 04/2021 progressive bronchiectasis, airway impaction, progressive subpleural reticulation   06/22/2022 Follow up: Bronchiectasis Patient returns for follow-up visit.  Patient complains that she has had increased cough over the last couple weeks.  She complained of increased cough, congestion, thick mucus and shortness of breath.  Patient says she really has not felt well over the last 3 months.  She was called in [place] for 7 days on March 7.  She is currently on day 6 of 7.  Prior to this she had Augmentin in January for bronchiectatic exacerbation.  Patient says she really does not feel that much better.  She complains of low energy and stamina.  She denies any hemoptysis, chest pain, orthopnea.  She remains on Symbicort twice daily.  Is using Robitussin for cough and congestion.  She does have a flutter valve.  We discussed regular use of this.    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     alendronate (FOSAMAX) 70 MG tablet Take 70 mg by mouth once a week.     arformoterol (BROVANA) 15 MCG/2ML NEBU Take 2 mLs (15 mcg total) by nebulization 2 (two) times daily. (Patient not taking: Reported on 05/26/2022) 120 mL 6   aspirin 81 MG chewable tablet Chew 81 mg by mouth daily.     atorvastatin (LIPITOR) 40 MG tablet Take 40 mg by mouth daily.     b complex vitamins capsule Take 1 capsule by mouth daily.     budesonide-formoterol (SYMBICORT) 160-4.5 MCG/ACT inhaler Inhale 2 puffs into the lungs 2 (two) times daily. 1 each 6   calcium-vitamin D (OSCAL WITH D) 500-200 MG-UNIT tablet Take 1 tablet by mouth daily.     co-enzyme Q-10 30 MG capsule Take by mouth.     dorzolamide-timolol (COSOPT) 2-0.5 %  ophthalmic solution Place 1 drop into both eyes 2 (two) times daily.     ibuprofen (ADVIL) 200 MG tablet Take 200 mg by mouth every 8 (eight) hours as needed.     latanoprost (XALATAN) 0.005 % ophthalmic solution Place 1 drop into both eyes at bedtime.     levofloxacin (LEVAQUIN) 500 MG tablet Take 1 tablet (500 mg total) by mouth daily. 7 tablet 0   loratadine (CLARITIN) 5 MG chewable tablet Chew 5 mg by mouth daily.     meclizine (ANTIVERT) 25 MG tablet Take 25 mg by mouth 3 (three) times daily as  needed for dizziness.     montelukast (SINGULAIR) 10 MG tablet Take 10 mg by mouth daily. (Patient not taking: Reported on 05/26/2022)     pantoprazole (PROTONIX) 40 MG tablet Take 40 mg by mouth daily.     promethazine-dextromethorphan (PROMETHAZINE-DM) 6.25-15 MG/5ML syrup Take 5 mLs by mouth every 6 (six) hours as needed for cough. (Patient not taking: Reported on 05/06/2022) 118 mL 0   No facility-administered medications prior to visit.     Review of Systems:   Constitutional:   No  weight loss, night sweats,  Fevers, chills,  +fatigue, or  lassitude.  HEENT:   No headaches,  Difficulty swallowing,  Tooth/dental problems, or  Sore throat,                No sneezing, itching, ear ache, nasal congestion, post nasal drip,   CV:  No chest pain,  Orthopnea, PND, swelling in lower extremities, anasarca, dizziness, palpitations, syncope.   GI  No heartburn, indigestion, abdominal pain, nausea, vomiting, diarrhea, change in bowel habits, loss of appetite, bloody stools.   Resp:   No chest wall deformity  Skin: no rash or lesions.  GU: no dysuria, change in color of urine, no urgency or frequency.  No flank pain, no hematuria   MS:  No joint pain or swelling.  No decreased range of motion.  No back pain.    Physical Exam    GEN: A/Ox3; pleasant , NAD, thin and frail   HEENT:  [place]/AT,  EACs-clear, TMs-wnl, NOSE-clear, THROAT-clear, no lesions, no postnasal drip or exudate noted.   NECK:  Supple w/ fair ROM; no JVD; normal carotid impulses w/o bruits; no thyromegaly or nodules palpated; no lymphadenopathy.    RESP scattered rhonchi no accessory muscle use, no dullness to percussion  CARD:  RRR, no m/r/g, no peripheral edema, pulses intact, no cyanosis or clubbing.  GI:   Soft & nt; nml bowel sounds; no organomegaly or masses detected.   Musco: Warm bil, no deformities or joint swelling noted.   Neuro: alert, no focal deficits noted.    Skin: Warm, no lesions or  rashes    Lab Results:  CBC No results found for: "WBC", "RBC", "HGB", "HCT", "PLT", "MCV", "MCH", "MCHC", "RDW", "LYMPHSABS", "MONOABS", "EOSABS", "BASOSABS"  BMET   BNP No results found for: "BNP"  ProBNP No results found for: "PROBNP"  Imaging: No results found.       Latest Ref Rng & Units 07/23/2021   10:06 AM  PFT Results  FVC-Pre L 2.31   FVC-Predicted Pre % 82   FVC-Post L 2.29   FVC-Predicted Post % 81   Pre FEV1/FVC % % 57   Post FEV1/FCV % % 51   FEV1-Pre L 1.31   FEV1-Predicted Pre % 62   FEV1-Post L 1.17   DLCO uncorrected ml/min/mmHg 9.99   DLCO UNC% % 52  DLCO corrected ml/min/mmHg 9.99   DLCO COR %Predicted % 52   DLVA Predicted % 80   TLC L 5.50   TLC % Predicted % 108   RV % Predicted % 142     No results found for: "NITRICOXIDE"      Assessment & Plan:   No problem-specific Assessment & Plan notes found for this encounter.     Rexene Edison, NP 06/22/2022

## 2022-06-23 NOTE — Telephone Encounter (Signed)
Tried to call to get more information. May need to see PCP for arm pain  Please make sure she is not having left sided chest pain with radiation to arm as this would need to be referred to ER.  CT chest was ordered at visit , will wait for results   Please contact office for sooner follow up if symptoms do not improve or worsen or seek emergency care

## 2022-06-24 NOTE — Assessment & Plan Note (Signed)
Slow to resolve bronchiectatic exacerbation.  Will have her finish Levaquin as prescribed.  Check sputum culture and sputum AFB.  Chest x-ray today.  Set up for high-resolution CT chest.  Continue with mucociliary clearance.  Encouraged to use flutter valve on a regular basis.  Can consider adding in vest therapy.  We discussed briefly patient wants to hold off until next visit to see if she needs this.  Plan  Patient Instructions  Sputum culture and sputum AFB  Set up for HRCT chest.  Continue on Symbicort 2 puffs Twice daily , rinse after use.  Finish Levaquin as directed.  Robitussin  1 tsp every 4hr as needed for congestion  Flutter valve Twice daily   Albuterol neb As needed   Chest xray today  Follow up with Dr. Lamonte Sakai  in 6-8 weeks and As needed   Please contact office for sooner follow up if symptoms do not improve or worsen or seek emergency care

## 2022-06-24 NOTE — Telephone Encounter (Signed)
Spoke with the pt and notified of response per Tammy P. She verbalized understanding. The pain is not radiating to her chest. She will call PCP and seek emergency care should symptoms get worse. Will await ct.

## 2022-07-01 ENCOUNTER — Telehealth: Payer: Self-pay | Admitting: Adult Health

## 2022-07-01 NOTE — Telephone Encounter (Signed)
Advised patient sputum sample is still needed per Tammy's last note. Patient advised she had cups and instructions and will do her best to get a sample. Nothing further is needed.

## 2022-07-01 NOTE — Telephone Encounter (Signed)
PT wonders is she still needs to provide a Sputum culture. She has not submitted one yet, she said. Last seen 3/13. AVS notes say the following:  Sputum culture and sputum AFB   Pls call PT to advise @ [phone]

## 2022-07-04 DIAGNOSIS — H401233 Low-tension glaucoma, bilateral, severe stage: Secondary | ICD-10-CM | POA: Diagnosis not present

## 2022-07-04 DIAGNOSIS — Z961 Presence of intraocular lens: Secondary | ICD-10-CM | POA: Diagnosis not present

## 2022-07-18 ENCOUNTER — Ambulatory Visit: Payer: Medicare Other

## 2022-07-18 ENCOUNTER — Encounter: Payer: Self-pay | Admitting: Psychology

## 2022-07-18 ENCOUNTER — Ambulatory Visit (INDEPENDENT_AMBULATORY_CARE_PROVIDER_SITE_OTHER): Payer: Medicare Other | Admitting: Psychology

## 2022-07-18 ENCOUNTER — Encounter: Payer: Self-pay | Admitting: Emergency Medicine

## 2022-07-18 ENCOUNTER — Telehealth: Payer: Self-pay

## 2022-07-18 DIAGNOSIS — I679 Cerebrovascular disease, unspecified: Secondary | ICD-10-CM

## 2022-07-18 DIAGNOSIS — R4189 Other symptoms and signs involving cognitive functions and awareness: Secondary | ICD-10-CM

## 2022-07-18 DIAGNOSIS — G3184 Mild cognitive impairment, so stated: Secondary | ICD-10-CM | POA: Diagnosis not present

## 2022-07-18 DIAGNOSIS — I6381 Other cerebral infarction due to occlusion or stenosis of small artery: Secondary | ICD-10-CM

## 2022-07-18 DIAGNOSIS — E785 Hyperlipidemia, unspecified: Secondary | ICD-10-CM | POA: Insufficient documentation

## 2022-07-18 HISTORY — DX: Mild cognitive impairment of uncertain or unknown etiology: G31.84

## 2022-07-18 NOTE — Progress Notes (Signed)
   Psychometrician Note   Cognitive testing was administered to Rock Nephew by Shan Levans, B.S. (psychometrist) under the supervision of Dr. Newman Nickels, Ph.D., licensed psychologist on 07/18/2022. Ms. Huetter did not appear overtly distressed by the testing session per behavioral observation or responses across self-report questionnaires. Rest breaks were offered.    The battery of tests administered was selected by Dr. Newman Nickels, Ph.D. with consideration to Ms. Gebhard's current level of functioning, the nature of her symptoms, emotional and behavioral responses during interview, level of literacy, observed level of motivation/effort, and the nature of the referral question. This battery was communicated to the psychometrist. Communication between Dr. Newman Nickels, Ph.D. and the psychometrist was ongoing throughout the evaluation and Dr. Newman Nickels, Ph.D. was immediately accessible at all times. Dr. Newman Nickels, Ph.D. provided supervision to the psychometrist on the date of this service to the extent necessary to assure the quality of all services provided.    FELISIA UHLMANN will return within approximately 1-2 weeks for an interactive feedback session with Dr. Milbert Coulter at which time her test performances, clinical impressions, and treatment recommendations will be reviewed in detail. Ms. Dohrman understands she can contact our office should she require our assistance before this time.  A total of 140 minutes of billable time were spent face-to-face with Ms. Sharon Seller by the psychometrist. This includes both test administration and scoring time. Billing for these services is reflected in the clinical report generated by Dr. Newman Nickels, Ph.D.  This note reflects time spent with the psychometrician and does not include test scores or any clinical interpretations made by Dr. Milbert Coulter. The full report will follow in a separate note.

## 2022-07-18 NOTE — Telephone Encounter (Signed)
PA request received via CMM for Budesonide-Formoterol Fumarate 160-4.5MCG/ACT aerosol  PA has been submitted to [facility] Medicare and is pending determination  Key: [id number]

## 2022-07-18 NOTE — Telephone Encounter (Signed)
Pt's daughter in law is stating Symbicort needs PA. Pharmacy can you please assist with this?

## 2022-07-18 NOTE — Progress Notes (Signed)
NEUROPSYCHOLOGICAL EVALUATION [place] Department of Neurology  Date of Evaluation: July 18, 2022  Reason for Referral:   Rock NephewJane B Im is a 77 y.o. right-handed Caucasian female referred by Patrcia DollyKaren Aquino, M.D., to characterize her current cognitive functioning and assist with diagnostic clarity and treatment planning in the context of subjective cognitive decline and concerns for syncopal episodes versus seizure-like activity.   Assessment and Plan:   Clinical Impression(s): Ms. Ma RingsLloyd's pattern of performance is suggestive of prominent impairment surrounding both semantic fluency and all aspects of learning and memory. Further performance variability was exhibited across processing speed, attention/concentration, executive functioning, and confrontation naming. Performances were appropriate relative to age-matched peers across safety/judgment, receptive language, phonemic fluency, and visuospatial abilities. Functionally, Ms. Sharon SellerLloyd denied all concerns. Her daughter-in-law was generally in agreement but did note some family provided assistance with certain day-to-day tasks. Given cognitive dysfunction described above, Ms. 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  Regarding the cause for ongoing cognitive impairment, I unfortunately have concerns for an underlying neurodegenerative illness, namely Alzheimer's disease. Across memory testing, Ms. Sharon SellerLloyd did not benefit from repeated exposure to novel information, was fully amnestic (i.e., 0% retention) across both verbal tasks and only exhibited 6% retention across a visual task after very brief delays, and performed very poorly across yes/no recognition trials. Taken together, this suggests the presence of rapid forgetting and a prominent storage impairment, both of which are the hallmark characteristics of this illness. Further impairments  across semantic fluency and variability/weakness in confrontation naming follow typical disease trajectory, further strengthening concerns. There remains the potential for a not insignificant vascular contribution given neuroimaging revealing moderate microvascular ischemic disease and a chronic lacunar infarct within the right corona radiata/basal ganglia. This may explain variability, especially surrounding processing speed, attention/concentration, and executive functioning. Unfortunately, these findings would not explain amnestic memory and other weaknesses. Overall, my concern is that said memory impairment is due to underlying Alzheimer's disease, made worse by cerebrovascular factors (mixed presentation).   I cannot comment on the likelihood of her prior syncopal events actually representing seizure activity or perhaps being direct correlates of vascular dysfunction. Current testing is not particularly lateralizing or localizing and would appear to fit a neurodegenerative profile far better than localized seizure activity. There remains the potential for underlying seizure activity to be present and worsening an also present illness such as Alzheimer's disease. However, EEG monitoring up until this point has been normal. Continued medical monitoring will be important moving forward.   Recommendations: A repeat neuropsychological evaluation in 18 months (or sooner if functional decline is noted) is recommended to assess the trajectory of future cognitive decline should it occur. This will also aid in future efforts towards improved diagnostic clarity.  Ms. Sharon SellerLloyd should discuss medications to help address memory loss with Dr. Karel JarvisAquino. It is important to highlight that these medications have been shown to slow functional decline in some individuals. There is no current treatment which can stop or reverse cognitive decline when caused by a neurodegenerative illness.   Performance across neurocognitive  testing is not a strong predictor of an individual's safety operating a motor vehicle. I do feel that concerns are warranted given some cognitive dysfunction, especially surrounding processing speed, attention/concentration, and multi-tasking. Should her family wish to pursue a formalized driving evaluation, they could reach out to the following agencies: [employer] in [place]: [phone] [facility]: [phone] [facility]: [phone] [facility]: [phone] or [phone]  Should there be progression of current deficits over time, Ms. Dosch is unlikely to regain any independent living skills lost. Therefore, it is recommended that she remain as involved as possible in all aspects of household chores, finances, and medication management, with supervision to ensure adequate performance. She will likely benefit from the establishment and maintenance of a routine in order to maximize her functional abilities over time.  It will be important for Ms. Brokaw to have another person with her when in situations where she may need to process information, weigh the pros and cons of different options, and make decisions, in order to ensure that she fully understands and recalls all information to be considered.  If not already done, Ms. Oborn and her family may want to discuss her wishes regarding durable power of attorney and medical decision making, so that she can have input into these choices. If they require legal assistance with this, long-term care resource access, or other aspects of estate planning, they could reach out to [employer] at [phone] for a free consultation. Additionally, they may wish to discuss future plans for caretaking and seek out community options for in home/residential care should they become necessary.  Ms. Minnifield is encouraged to attend to lifestyle factors for brain health (e.g., regular physical exercise, good  nutrition habits and consideration of the MIND-DASH diet, regular participation in cognitively-stimulating activities, and general stress management techniques), which are likely to have benefits for both emotional adjustment and cognition. Optimal control of vascular risk factors (including safe cardiovascular exercise and adherence to dietary recommendations) is encouraged. Continued participation in activities which provide mental stimulation and social interaction is also recommended.   Important information should be provided to Ms. Deberry in written format in all instances. This information should be placed in a highly frequented and easily visible location within her home to promote recall. External strategies such as written notes in a consistently used memory journal, visual and nonverbal auditory cues such as a calendar on the refrigerator or appointments with alarm, such as on a cell phone, can also help maximize recall.  To address problems with processing speed, she may wish to consider:   -Ensuring that she is alerted when essential material or instructions are being presented   -Adjusting the speed at which new information is presented   -Allowing for more time in comprehending, processing, and responding in conversation   -Repeating and paraphrasing instructions or conversations aloud  To address problems with fluctuating attention and/or executive dysfunction, she may wish to consider:   -Avoiding external distractions when needing to concentrate   -Limiting exposure to fast paced environments with multiple sensory demands   -Writing down complicated information and using checklists   -Attempting and completing one task at a time (i.e., no multi-tasking)   -Verbalizing aloud each step of a task to maintain focus   -Taking frequent breaks during the completion of steps/tasks to avoid fatigue   -Reducing the amount of information considered at one time   -Scheduling more difficult  activities for a time of day where she is usually most alert  Review of Records:   Ms. Wanner was seen by [facility] Marland KitchenPatrcia Dolly, M.D.) on 02/16/2022 for an evaluation of syncope and memory loss. Briefly, on 11/29/2021, Ms. Notte was a passenger in the car and told her daughter she was not feeling well, asking to be taken home and that she needed to go to the bathroom.  She then passed out and woke up to EMS around her. Her daughter reported her head went back, her eyes rolled back, her arms flexed, and she was rigid and had shaking, making a very vocal sound ("gurgling moan"). This lasted less than three minutes. No tongue bite or incontinence was noted. She did not recall feeling dizzy, having headache symptoms, or experiencing focal numbness/tingling/weakness. She was very sleepy when she got to the hospital, was nauseated, and vomited in the hospital. A brain MRI at that time revealed moderately severe microvascular ischemic changes, said to have progressed relative to a 09/15/20 MRI. There was also a chronic lacunar infarct within the right corona radiata/basal ganglia, new relative to her prior MRI, as well as mild generalized cerebral atrophy. Carotid dopplers showed no significant stenosis and her echocardiogram was normal. Zio patch did not show any sustained arrhythmia or prolonged pauses; occasional PVCs and short SV/V runs were present. On 02/03/2022 she had another syncopal episode. She was with her realtor looking at a house, recalling coming out of the house and felt like she missed a step. She then felt like she was getting lightheaded with the world fading. The realtor reported that Ms. Belk was stepping down steps and sat down hard on the ground. Her realtor went in to get a rag and could tell she was going to fall over so she sat next to her as Ms. Boldin briefly passed out with a little bit of jerking. She threw up when EMS arrived and faded out again with EMS, saying she wanted to go home.  She was pale with note of HR was in the upper 30s/low 40s. In the ER, HR was in the 50s.   Family has also been concerned about her memory. They described ongoing confusion and increased repetitive in conversation, particularly since her husband passed away in September 16, 2019. She was noted to be taking Xanax at that time. Upon stopping, symptoms did improve but some persisted. Functionally, Ms. Disch lives alone and denied day-to-day functional concerns. Family noted concerns that she is not aware of what she is taking or when she takes it. Performance on a brief cognitive screening instrument (MOCA) was 18/30. Ultimately, Ms. Navarrete was referred for a comprehensive neuropsychological evaluation to characterize her cognitive abilities and to assist with diagnostic clarity and treatment planning.    Bloodwork at PCP office in 12/2021 showed an LDL of 58, total cholesterol of 137. Bloodwork in 05/2021 showed a normal B12 of 381, normal TSH.   She was most recently seen by Dr. Karel Jarvis on 05/26/2022 for follow-up. No further syncopal versus seizure-like episodes were reported in the interim. Cognition was described as stable.   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  We discussed repeat injection today and she is interested in pursuing this on a more regular basis to  control her symptoms 2.  Steroid injection  placed right CMC joint 20 mg Depo-Medrol 1/2 cc 1% Xylocaine with bicarb after Betadine skin prep well-tolerated 3.  Follow-up in 4 to 53-month intervals f   Pseudophakia of both eyes 02/15/2021   Seizure-like activity 11/29/2021   Syncope and collapse 02/11/2022    Past Surgical History:  Procedure Laterality Date   BACK SURGERY     ENDOLYMPHATIC [facility] DECOMPRESSION Right 11/16/2020   Procedure: RIGHT ENDOLYMPHATIC SAC DECOMPRESSION;  Surgeon: Newman Pies, MD;  Location: [facility];  Service: ENT;  Laterality: Right;    Current Outpatient Medications:    albuterol (PROVENTIL) (2.5 MG/3ML) 0.083% nebulizer solution, Take 2.5 mg by nebulization 2 (two) times daily., Disp: , Rfl:    alendronate (FOSAMAX) 70 MG tablet, Take 70 mg by mouth once a week., Disp: , Rfl:    arformoterol (BROVANA) 15 MCG/2ML NEBU, Take 2 mLs (15 mcg total) by nebulization 2 (two) times daily., Disp: 120 mL, Rfl: 6   aspirin 81 MG chewable tablet, Chew 81 mg by mouth daily., Disp: , Rfl:    atorvastatin (LIPITOR) 40 MG tablet, Take 40 mg by mouth daily., Disp: , Rfl:    b complex vitamins capsule, Take 1 capsule by mouth daily., Disp: , Rfl:    budesonide-formoterol (SYMBICORT) 160-4.5 MCG/ACT inhaler, Inhale 2 puffs into the lungs 2 (two) times daily., Disp: 1 each, Rfl: 6   calcium-vitamin D (OSCAL WITH D) 500-200 MG-UNIT tablet, Take 1 tablet by mouth daily., Disp: , Rfl:    co-enzyme Q-10 30 MG capsule, Take by mouth., Disp: , Rfl:    dorzolamide-timolol (COSOPT) 2-0.5 % ophthalmic solution, Place 1 drop into both eyes 2 (two) times daily., Disp: , Rfl:    ibuprofen (ADVIL) 200 MG tablet, Take 200 mg by mouth every 8 (eight) hours as needed., Disp: , Rfl:    latanoprost (XALATAN) 0.005 % ophthalmic solution, Place 1 drop into both eyes at bedtime., Disp: , Rfl:    levofloxacin (LEVAQUIN) 500 MG tablet, Take 1 tablet (500 mg total) by mouth daily., Disp: 7 tablet,  Rfl: 0   loratadine (CLARITIN) 5 MG chewable tablet, Chew 5 mg by mouth daily., Disp: , Rfl:    meclizine (ANTIVERT) 25 MG tablet, Take 25 mg by mouth 3 (three) times daily as needed for dizziness., Disp: , Rfl:    montelukast (SINGULAIR) 10 MG tablet, Take 10 mg by mouth daily. (Patient not taking: Reported on 05/26/2022), Disp: , Rfl:    pantoprazole (PROTONIX) 40 MG tablet, Take 40 mg by mouth daily., Disp: , Rfl:   Clinical Interview:   The following information was obtained during a clinical interview with Ms. Maciolek and her daughter-in-law prior to cognitive testing.  Cognitive Symptoms: Decreased short-term memory: Denied. Her daughter-in-law noted that she and other family members have observed Ms. Colgate as being quite repetitive in conversation, sometimes exhibiting concerns surrounding rapid forgetting. She also noted that Ms. Guerrieri will call physician offices with questions and then not remember the reason for her initial call when the office calls her back (or that she had called at all). Per her daughter-in-law, decline has been present for at least the past year or so. Records suggest potential concerns dating back to 2021.  Decreased long-term memory: Denied. Decreased attention/concentration: Denied. Her daughter-in-law wondered about an increased ease in distractibility. She noted that it will take Ms. Twersky an extremely long time shopping at the grocery store. She theorized that Ms. Kalil may get distracted by various foods, only to then  forget what she was initially searching for. This results in her "circling" the store for extended periods of time.  Reduced processing speed: Denied. Difficulties with executive functions: Denied. Difficulties with emotion regulation: Denied. Difficulties with receptive language: Denied. Difficulties with word finding: Denied. Her daughter-in-law noted that Ms. Gagnon may have greater trouble spelling typical day-to-day words.   Decreased  visuoperceptual ability: Denied.  Difficulties completing ADLs: Largely denied. Ms. Mahowald denied all functional concerns. Her daughter-in-law noted that family has expressed some concerns surrounding medication management. They have provided some assistance surrounding the creation of lists to help organize her medications accurately. No family concerns surrounding overtaking medications were reported. Ms. Aberg has not driven since her last syncopal versus seizure-like activity for the past several months, citing [address] Prior to this, no driving related concerns were noted.   Additional Medical History: History of traumatic brain injury/concussion: Denied. History of stroke: Endorsed (see above).  History of seizure activity: Unclear (see above). No syncopal versus seizure-like episodes were said to have occurred since October 2023.  History of known exposure to toxins: Denied. Symptoms of chronic pain: Denied. Experience of frequent headaches/migraines: Endorsed. She reported generally mild headache symptoms occurring several days per week. Sometimes she will take Tylenol to help; other times, medication is not necessary for pain management.  Frequent instances of dizziness/vertigo: Denied. However, prominent vertigo spells have been present in the past. This has been much improved since her right endolymphatic sac decompression procedure in 2022.  Sensory changes: She has severe symptoms of glaucoma but is able to see functionally without difficulty. She also reported hearing loss in her right ear. No hearing aid intervention was reported. Other sensory changes/difficulties (e.g., taste or smell) were reported.  Balance/coordination difficulties: Denied. She denied any recent falls. However, since her syncopal episodes, she did report being far more cautious while walking. One side of the body was not said to be weaker than the other.  Other motor difficulties: Denied.  Other medical  conditions: Her daughter-in-law reported poor nutritional intake as of late, expressing concern that Ms. Yarbough will "pick" at food and has lost weight as a result. She also noted that her sodium and potassium levels were abnormal around the time of prior syncopal episodes.   Sleep History: Estimated hours obtained each night: 8 hours.  Difficulties falling asleep: Denied. Difficulties staying asleep: Denied. Feels rested and refreshed upon awakening: Endorsed. Her daughter-in-law did note that, behaviorally, Ms. Palos seems to lounge about and can be seen in her robe later in the morning or early afternoon. While she was not concerned with this outright, her daughter-in-law noted that this was a personality change.  History of snoring: Unclear.  History of waking up gasping for air: Denied. Witnessed breath cessation while asleep: Denied.  History of vivid dreaming: Denied. Excessive movement while asleep: Denied. Instances of acting out her dreams: Denied.  Psychiatric/Behavioral Health History: Depression: She described her current mood as "normal" and denied to her knowledge any prior mental health concerns or formal diagnoses. Her daughter-in-law noted that she was prescribed some mood-related medications around the time of Ms. Cheaney's husband's passing in 2021. No current medication intervention was reported. Current or remote suicidal ideation, intent, or plan was denied.  Anxiety: Denied. Mania: Denied. Trauma History: Denied. Visual/auditory hallucinations: Denied. Delusional thoughts: Denied.  Tobacco: Denied. Alcohol: She denied current alcohol consumption as well as a history of problematic alcohol abuse or dependence.  Recreational drugs: Denied.  Family History: Problem Relation Age  of Onset   Heart disease Father    Heart attack Father    Hyperlipidemia Father    Dementia Paternal Aunt        several aunts w/dementia and concerns for Alzheimer's disease; onset likely  early 21s   This information was confirmed by Ms. Halterman.  Academic/Vocational History: Highest level of educational attainment: 12 years. She graduated from high school and described herself as an average (B/C) student in academic settings. No relative weaknesses were reported.  History of developmental delay: Denied. History of grade repetition: Denied. Enrollment in special education courses: Denied. History of LD/ADHD: Denied.  Employment: Retired. 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   Evaluation Results:   Behavioral Observations: Ms. Freiman was accompanied by her daughter-in-law, arrived to her appointment on time, and was appropriately dressed and groomed. She appeared alert. Observed gait and station were within normal limits. Gross motor functioning appeared intact upon informal observation and no abnormal movements (e.g., tremors) were noted. Her affect was generally relaxed and positive. Spontaneous speech was fluent and word finding difficulties were not observed during the clinical interview. Thought processes were coherent, organized, and normal in content. Insight into her cognitive difficulties appeared poor in that she denied all cognitive concerns despite objective testing revealing several areas of dysfunction, including quite significant memory impairment.   During testing, sustained attention was appropriate. Task engagement was adequate and she persisted when challenged. Overall, Ms. Grilli was cooperative with the clinical interview and subsequent testing procedures.   Adequacy of Effort: The validity of neuropsychological testing is limited by the extent to which the individual being tested may be assumed to have exerted adequate effort during testing. Ms. Aslin expressed her intention to perform to the best of her abilities and exhibited adequate task engagement and persistence. Scores across stand-alone  and embedded performance validity measures were variable but largely within expectation. Her sole below expectation performance is believed to be due to true memory impairment rather than poor engagement or attempts to perform poorly. As such, the results of the current evaluation are believed to be a valid representation of Ms. Steelman's current cognitive functioning.  Test Results: Ms. Ales was fully oriented at the time of the current evaluation.  Intellectual abilities based upon educational and vocational attainment were estimated to be in the average range. Premorbid abilities were estimated to be within the average range based upon a single-word reading test.   Processing speed was variable, ranging from the well below average to average normative ranges. Basic attention was variable, ranging from the well below average to average normative ranges. More complex attention (e.g., working memory) was also variable, ranging from the exceptionally low to average normative ranges. Executive functioning was further variable, ranging from the exceptionally low to above average normative ranges. She did perform in the well above average normative range across a task assessing safety and judgment.   Assessed receptive language abilities were average. Likewise, Ms. Bechler did not exhibit any difficulties comprehending task instructions and answered all questions asked of her appropriately. Assessed expressive language was mildly variable. Phonemic fluency was average, semantic fluency was well below average, and confrontation naming was average across a screening task but well below average across a more comprehensive task.   Assessed visuospatial/visuoconstructional abilities were below average to average. Points were lost on her drawing of a clock due to confusion and the inability to place the clock hands.    Learning (i.e.,  encoding) of novel verbal information was exceptionally low to well below average.  Spontaneous delayed recall (i.e., retrieval) of previously learned information was exceptionally low. Retention rates were 0% across a story learning task, 0% across a list learning task, and 6% across a figure drawing task. Performance across recognition tasks was exceptionally low to well below average, suggesting negligible evidence for information consolidation.  Fine motor coordination and speed was well below average bilaterally.    Results of emotional screening instruments suggested that recent symptoms of generalized anxiety were in the minimal range, while symptoms of depression were within normal limits. A screening instrument assessing recent sleep quality suggested the presence of minimal sleep dysfunction.  Tables of Scores:   Note: This summary of test scores accompanies the interpretive report and should not be considered in isolation without reference to the appropriate sections in the text. Descriptors are based on appropriate normative data and may be adjusted based on clinical judgment. Terms such as "Within Normal Limits" and "Outside Normal Limits" are used when a more specific description of the test score cannot be determined.       Percentile - Normative Descriptor > 98 - Exceptionally High 91-97 - Well Above Average 75-90 - Above Average 25-74 - Average 9-24 - Below Average 2-8 - Well Below Average < 2 - Exceptionally Low       Validity:   DESCRIPTOR       Dot Counting Test: --- --- Within Normal Limits  RBANS Effort Index: --- --- Outside Normal Limits  WAIS-IV Reliable Digit Span: --- --- Within Normal Limits       Orientation:      Raw Score Percentile   NAB Orientation, Form 1 29/29 --- ---       Cognitive Screening:      Raw Score Percentile   SLUMS: 20/30 --- ---       RBANS, Form A: Standard Score/ Scaled Score Percentile   Total Score 62 1 Exceptionally Low  Immediate Memory 61 <1 Exceptionally Low    List Learning 4 2 Well Below Average     Story Memory 3 1 Exceptionally Low  Visuospatial/Constructional 87 19 Below Average    Figure Copy 9 37 Average    Line Orientation 14/20 17-25 Below Average to Average  Language 85 16 Below Average    Picture Naming 10/10 51-75 Average    Semantic Fluency 4 2 Well Below Average  Attention 72 3 Well Below Average    Digit Span 5 5 Well Below Average    Coding 6 9 Below Average  Delayed Memory 44 <1 Exceptionally Low    List Recall 0/10 <2 Exceptionally Low    List Recognition 13/20 <2 Exceptionally Low    Story Recall 1 <1 Exceptionally Low    Story Recognition 7/12 5-7 Well Below Average    Figure Recall 2 <1 Exceptionally Low    Figure Recognition 1/8 1 Exceptionally Low        Intellectual Functioning:      Standard Score Percentile   Test of Premorbid Functioning: 92 30 Average       Attention/Executive Function:     Trail Making Test (TMT): Raw Score (T Score) Percentile     Part A 71 secs.,  0 errors (30) 2 Well Below Average    Part B Discontinued --- Impaired         Scaled Score Percentile   WAIS-IV Digit Span: 5 5 Well Below Average    Forward 8  25 Average    Backward 8 25 Average    Sequencing 2 <1 Exceptionally Low        Scaled Score Percentile   WAIS-IV Similarities: 7 16 Below Average       D-KEFS Color-Word Interference Test: Raw Score (Scaled Score) Percentile     Color Naming 36 secs. (9) 37 Average    Word Reading 26 secs. (10) 50 Average    Inhibition 57 secs. (13) 84 Above Average      Total Errors 1 error (12) 75 Above Average    Inhibition/Switching 68 secs. (12) 75 Above Average      Total Errors 21 errors (1) <1 Exceptionally Low       D-KEFS Verbal Fluency Test: Raw Score (Scaled Score) Percentile     Letter Total Correct 30 (9) 37 Average    Category Total Correct 21 (5) 5 Well Below Average    Category Switching Total Correct 6 (3) 1 Exceptionally Low    Category Switching Accuracy 3 (2) <1 Exceptionally Low      Total Set Loss Errors 4  (8) 25 Average      Total Repetition Errors 1 (12) 75 Above Average       NAB Executive Functions Module, Form 1: T Score Percentile     Judgment 66 95 Well Above Average       Language:     Verbal Fluency Test: Raw Score (T Score) Percentile     Phonemic Fluency (FAS) 30 (44) 27 Average    Animal Fluency 11 (34) 5 Well Below Average        NAB Language Module, Form 1: T Score Percentile     Auditory Comprehension 45 31 Average    Naming 25/31 (35) 7 Well Below Average       Visuospatial/Visuoconstruction:      Raw Score Percentile   Clock Drawing: 7/10 --- Within Normal Limits        Scaled Score Percentile   WAIS-IV Block Design: 8 25 Average       Sensory-Motor:     [place] Grooved Pegboard Test: Raw Score Percentile     Dominant Hand 124 secs.,  0 drops  8 Well Below Average    Non-Dominant Hand 200 secs.,  1 drop 2 Well Below Average       Mood and Personality:      Raw Score Percentile   Geriatric Depression Scale: 0 --- Within Normal Limits  Geriatric Anxiety Scale: 0 --- Minimal    Somatic 0 --- Minimal    Cognitive 0 --- Minimal    Affective 0 --- Minimal       Additional Questionnaires:      Raw Score Percentile   PROMIS Sleep Disturbance Questionnaire: 8 --- None to Slight   Informed Consent and Coding/Compliance:   The current evaluation represents a clinical evaluation for the purposes previously outlined by the referral source and is in no way reflective of a forensic evaluation.   Ms. Gashi was provided with a verbal description of the nature and purpose of the present neuropsychological evaluation. Also reviewed were the foreseeable risks and/or discomforts and benefits of the procedure, limits of confidentiality, and mandatory reporting requirements of this provider. The patient was given the opportunity to ask questions and receive answers about the evaluation. Oral consent to participate was provided by the patient.   This evaluation was conducted  by Newman Nickels, Ph.D., ABPP-CN, board certified clinical neuropsychologist. Ms. Duffey completed a clinical  interview with Dr. Milbert Coulter, billed as one unit [phone], and 140 minutes of cognitive testing and scoring, billed as one unit [phone] and four additional units [id number]. Psychometrist Shan Levans, B.S., assisted Dr. Milbert Coulter with test administration and scoring procedures. As a separate and discrete service, Dr. Milbert Coulter spent a total of 160 minutes in interpretation and report writing billed as one unit [phone] and two units [id number].

## 2022-07-19 DIAGNOSIS — E1165 Type 2 diabetes mellitus with hyperglycemia: Secondary | ICD-10-CM | POA: Diagnosis not present

## 2022-07-19 DIAGNOSIS — I10 Essential (primary) hypertension: Secondary | ICD-10-CM | POA: Diagnosis not present

## 2022-07-19 DIAGNOSIS — E7801 Familial hypercholesterolemia: Secondary | ICD-10-CM | POA: Diagnosis not present

## 2022-07-19 DIAGNOSIS — E7849 Other hyperlipidemia: Secondary | ICD-10-CM | POA: Diagnosis not present

## 2022-07-19 NOTE — Telephone Encounter (Signed)
PA has been APPROVED from 07/18/2022 until further notice through [facility]

## 2022-07-25 ENCOUNTER — Ambulatory Visit (INDEPENDENT_AMBULATORY_CARE_PROVIDER_SITE_OTHER): Payer: Medicare Other | Admitting: Psychology

## 2022-07-25 ENCOUNTER — Telehealth: Payer: Self-pay | Admitting: Neurology

## 2022-07-25 DIAGNOSIS — I6381 Other cerebral infarction due to occlusion or stenosis of small artery: Secondary | ICD-10-CM | POA: Diagnosis not present

## 2022-07-25 DIAGNOSIS — G3184 Mild cognitive impairment, so stated: Secondary | ICD-10-CM

## 2022-07-25 DIAGNOSIS — I679 Cerebrovascular disease, unspecified: Secondary | ICD-10-CM | POA: Diagnosis not present

## 2022-07-25 MED ORDER — DONEPEZIL HCL 10 MG PO TABS: ORAL_TABLET | ORAL | 6 refills | Status: DC

## 2022-07-25 NOTE — Telephone Encounter (Signed)
Patient just saw Milbert Coulter today for feed back and patient wants to know if we can call a medication to help her without being seen by Karel Jarvis before her 12-21-22 appt. Dr Milbert Coulter was not sure if Karel Jarvis would need to see her to be able to do that or not. They use the [facility]    Please call patient daughter [facility]

## 2022-07-25 NOTE — Telephone Encounter (Signed)
Patient's daughter in law advised.  °

## 2022-07-25 NOTE — Progress Notes (Signed)
   Neuropsychology Feedback Session Jenna Rasmussen. [facility] [place] Department of Neurology  Reason for Referral:   Jenna Rasmussen is a 77 y.o. right-handed Caucasian female referred by Patrcia Dolly, M.D., to characterize her current cognitive functioning and assist with diagnostic clarity and treatment planning in the context of subjective cognitive decline and concerns for syncopal episodes versus seizure-like activity.   Feedback:   Ms. Jenna Rasmussen completed a comprehensive neuropsychological evaluation on 07/18/2022. Please refer to that encounter for the full report and recommendations. Briefly, results suggested prominent impairment surrounding both semantic fluency and all aspects of learning and memory. Further performance variability was exhibited across processing speed, attention/concentration, executive functioning, and confrontation naming. Regarding the cause for ongoing cognitive impairment, I unfortunately have concerns for an underlying neurodegenerative illness, namely Alzheimer's disease. Across memory testing, Ms. Jenna Rasmussen did not benefit from repeated exposure to novel information, was fully amnestic (i.e., 0% retention) across both verbal tasks and only exhibited 6% retention across a visual task after very brief delays, and performed very poorly across yes/no recognition trials. Taken together, this suggests the presence of rapid forgetting and a prominent storage impairment, both of which are the hallmark characteristics of this illness. Further impairments across semantic fluency and variability/weakness in confrontation naming follow typical disease trajectory, further strengthening concerns. There remains the potential for a not insignificant vascular contribution given neuroimaging revealing moderate microvascular ischemic disease and a chronic lacunar infarct within the right corona radiata/basal ganglia. This may explain variability, especially surrounding processing speed,  attention/concentration, and executive functioning. Unfortunately, these findings would not explain amnestic memory and other weaknesses. Overall, my concern is that said memory impairment is due to underlying Alzheimer's disease, made worse by cerebrovascular factors (mixed presentation).    Ms. Jenna Rasmussen was accompanied by her son and daughter-in-law during the current feedback session. Content of the current session focused on the results of her neuropsychological evaluation. Ms. Jenna Rasmussen was given the opportunity to ask questions and her questions were answered. She was encouraged to reach out should additional questions arise. A copy of her report was provided at the conclusion of the visit.      Greater than 31 minutes were spent preparing for, conducting, and documenting the current feedback session with Ms. Jenna Rasmussen, billed as one unit [phone].

## 2022-07-25 NOTE — Telephone Encounter (Signed)
Pls let daughter know I sent in a prescription for Donepezil. It is not a cure for memory loss, it does not improve memory but can help slow down worsening of memory loss. Side effects can include diarrhea, nausea. We have to start at a low dose 10mg : 1/2 tablet daily for 2 weeks, then increase to 1 tablet daily.

## 2022-07-26 DIAGNOSIS — Z23 Encounter for immunization: Secondary | ICD-10-CM | POA: Diagnosis not present

## 2022-07-26 DIAGNOSIS — R634 Abnormal weight loss: Secondary | ICD-10-CM | POA: Diagnosis not present

## 2022-07-26 DIAGNOSIS — Z1331 Encounter for screening for depression: Secondary | ICD-10-CM | POA: Diagnosis not present

## 2022-07-26 DIAGNOSIS — R053 Chronic cough: Secondary | ICD-10-CM | POA: Diagnosis not present

## 2022-07-26 DIAGNOSIS — Z0001 Encounter for general adult medical examination with abnormal findings: Secondary | ICD-10-CM | POA: Diagnosis not present

## 2022-07-26 DIAGNOSIS — G3184 Mild cognitive impairment, so stated: Secondary | ICD-10-CM | POA: Diagnosis not present

## 2022-07-26 DIAGNOSIS — K219 Gastro-esophageal reflux disease without esophagitis: Secondary | ICD-10-CM | POA: Diagnosis not present

## 2022-07-26 DIAGNOSIS — Z1389 Encounter for screening for other disorder: Secondary | ICD-10-CM | POA: Diagnosis not present

## 2022-07-26 DIAGNOSIS — Z20828 Contact with and (suspected) exposure to other viral communicable diseases: Secondary | ICD-10-CM | POA: Diagnosis not present

## 2022-07-26 DIAGNOSIS — R030 Elevated blood-pressure reading, without diagnosis of hypertension: Secondary | ICD-10-CM | POA: Diagnosis not present

## 2022-07-26 DIAGNOSIS — Z681 Body mass index (BMI) 19 or less, adult: Secondary | ICD-10-CM | POA: Diagnosis not present

## 2022-07-26 DIAGNOSIS — R5383 Other fatigue: Secondary | ICD-10-CM | POA: Diagnosis not present

## 2022-08-01 ENCOUNTER — Ambulatory Visit (HOSPITAL_COMMUNITY)
Admission: RE | Admit: 2022-08-01 | Discharge: 2022-08-01 | Disposition: A | Discharge status: Home / Self Care | Payer: Medicare Other | Source: Ambulatory Visit | Attending: Adult Health | Admitting: Adult Health

## 2022-08-01 DIAGNOSIS — J9809 Other diseases of bronchus, not elsewhere classified: Secondary | ICD-10-CM | POA: Diagnosis not present

## 2022-08-01 DIAGNOSIS — J471 Bronchiectasis with (acute) exacerbation: Secondary | ICD-10-CM | POA: Insufficient documentation

## 2022-08-01 DIAGNOSIS — J479 Bronchiectasis, uncomplicated: Secondary | ICD-10-CM | POA: Diagnosis not present

## 2022-08-01 DIAGNOSIS — J181 Lobar pneumonia, unspecified organism: Secondary | ICD-10-CM | POA: Diagnosis not present

## 2022-08-03 ENCOUNTER — Ambulatory Visit (INDEPENDENT_AMBULATORY_CARE_PROVIDER_SITE_OTHER): Payer: Medicare Other | Admitting: Emergency Medicine

## 2022-08-03 ENCOUNTER — Other Ambulatory Visit (HOSPITAL_COMMUNITY): Payer: Self-pay

## 2022-08-03 ENCOUNTER — Encounter: Payer: Self-pay | Admitting: Emergency Medicine

## 2022-08-03 VITALS — BP 122/68 | HR 68 | Temp 98.2°F | Ht 62.0 in | Wt 99.6 lb

## 2022-08-03 DIAGNOSIS — J301 Allergic rhinitis due to pollen: Secondary | ICD-10-CM | POA: Diagnosis not present

## 2022-08-03 DIAGNOSIS — J471 Bronchiectasis with (acute) exacerbation: Secondary | ICD-10-CM

## 2022-08-03 MED ORDER — FLUTICASONE PROPIONATE 50 MCG/ACT NA SUSP: 2.0000 | Freq: Every day | NASAL | 2 refills | Status: AC

## 2022-08-03 NOTE — Progress Notes (Signed)
Subjective:    Patient ID: Jenna Rasmussen, female    DOB: [birth year], 78 y.o.   MRN: [id number]   HPI  [name] 06/07/2022 --77 year old former smoker with a history of CLL and longstanding bronchiectasis.  She is positive for [facility], also with past positive cultures for MSSA, stenotrophomonas, Acinetobacter.  She was treated for an acute flare of her bronchiectasis with Augmentin 05/06/2022.  At that time Jenna Rasmussen was added to her regimen but unable to get this due to cost and insurance coverage, she started flutter valve twice daily. She is doing albuterol bid.  Today she reports that she improved some on the augmentin - less SOB, cough is about the same.   Chest x-ray 05/06/2022 reviewed by me showed increased reticular nodular opacity in left lower lobe, question involving pneumonia.  Significant thoracic kyphosis  [name] 07/30/2022 --77 year old woman with multifactorial shortness of breath, cough.  She is a former smoker with a history of CLL and longstanding bronchiectasis, MAIC.  Also colonized with MSSA, stenotrophomonas, Acinetobacter.  Has been managed on generic Symbicort.  She had an exacerbation at the beginning of the year, slow to resolve, last seen by TP in mid March.  Completed levofloxacin.  She has a flutter valve, uses approximately She is having head fullness, HA's. She is having a lot of nasal congestion. Not on loratadine, thinks she is on the singulair qhs She is using albuterol nebs about 1-2x a day. Tried to get an AFB specimen but was unable to obtain.   CT scan of the chest high-resolution 08/01/2022 reviewed by me, shows diffuse scattered bronchiectasis at both bases without any significant mucus impaction, infiltrates or micronodular disease.   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  We discussed repeat injection today and she is interested in pursuing this on a more regular basis to control her symptoms 2.  Steroid injection placed right CMC joint 20 mg Depo-Medrol 1/2 cc 1% Xylocaine with bicarb after Betadine skin prep well-tolerated 3.  Follow-up in 4 to 64-month intervals f   Pseudophakia of both eyes 02/15/2021   Seizure-like activity 11/29/2021   Syncope and collapse 02/11/2022     Family History  Problem Relation Age of Onset   Heart disease Father    Heart attack Father    Hyperlipidemia Father    Dementia Paternal Aunt        several aunts w/dementia and concerns for Alzheimer's disease; onset likely early 72s     Social History   Socioeconomic History   Marital status: Widowed    Spouse name: Not on file   Number of children: Not on file   Years of education: 12   Highest education level: High school graduate  Occupational History   Occupation: Retired  Comment: Secretarial  Tobacco Use   Smoking status: Former    Types: Cigarettes    Quit date: 04/12/1983    Years since quitting: 39.3   Smokeless tobacco: Never   Tobacco comments:    smoked x 20 yrs  Vaping Use   Vaping Use: Never used  Substance and Sexual Activity   Alcohol use: Never   Drug use: Never   Sexual activity: Not on file  Other Topics Concern   Not on file  Social History Narrative   Are you right handed or left  handed? Right handed   Are you currently employed ? Retired    What is your current occupation? NA   Do you live at home alone? alone   Who lives with you? NA   What type of home do you live in: 1 story or 2 story? 1 story        Social Determinants of Corporate investment banker Strain: Not on file  Food Insecurity: Not on file  Transportation Needs: Not on file  Physical Activity: Not on file  Stress: Not on file  Social Connections: Not on file  Intimate Partner Violence: Not on file     Allergies  Allergen Reactions   Rifampin     Liver problems and patient ended up in hospital   Codeine     Dizziness stomach upset   Flagyl [Metronidazole Hcl]     rash     Outpatient Medications Prior to Visit  Medication Sig Dispense Refill   albuterol (PROVENTIL) (2.5 MG/3ML) 0.083% nebulizer solution Take 2.5 mg by nebulization 2 (two) times daily.     alendronate (FOSAMAX) 70 MG tablet Take 70 mg by mouth once a week.     arformoterol (BROVANA) 15 MCG/2ML NEBU Take 2 mLs (15 mcg total) by nebulization 2 (two) times daily. 120 mL 6   aspirin 81 MG chewable tablet Chew 81 mg by mouth daily.     atorvastatin (LIPITOR) 40 MG tablet Take 40 mg by mouth daily.     b complex vitamins capsule Take 1 capsule by mouth daily.     budesonide-formoterol (SYMBICORT) 160-4.5 MCG/ACT inhaler Inhale 2 puffs into the lungs 2 (two) times daily. 1 each 6   calcium-vitamin D (OSCAL WITH D) 500-200 MG-UNIT tablet Take 1 tablet by mouth daily.     co-enzyme Q-10 30 MG capsule Take by mouth.     donepezil (ARICEPT) 10 MG tablet Take 1/2 tablet daily for 2 weeks, then increase to 1 tablet daily and continue 30 tablet 6   dorzolamide-timolol (COSOPT) 2-0.5 % ophthalmic solution Place 1 drop into both eyes 2 (two) times daily.     ibuprofen (ADVIL) 200 MG tablet Take 200 mg by mouth every 8 (eight) hours as needed.     latanoprost (XALATAN) 0.005 % ophthalmic solution Place 1 drop into both eyes at bedtime.      levofloxacin (LEVAQUIN) 500 MG tablet Take 1 tablet (500 mg total) by mouth daily. 7 tablet 0   loratadine (CLARITIN) 5 MG chewable tablet Chew 5 mg by mouth daily.     meclizine (ANTIVERT) 25 MG tablet Take 25 mg by mouth 3 (three) times daily as needed for dizziness.     pantoprazole (PROTONIX) 40 MG tablet Take 40 mg by mouth daily.     montelukast (SINGULAIR) 10 MG tablet Take 10 mg by mouth daily. (Patient not taking: Reported on 08/03/2022)     No facility-administered medications prior to visit.  Objective:   Physical Exam Vitals:   08/03/22 0956  BP: 122/68  Pulse: 68  Temp: 98.2 F (36.8 C)  TempSrc: Oral  SpO2: 94%  Weight: 99 lb 9.6 oz (45.2 kg)  Height: 5\' 2"  (1.575 m)   Gen: Pleasant, well-nourished, in no distress,  normal affect  ENT: No lesions,  mouth clear,  oropharynx clear, no postnasal drip  Neck: No JVD, no stridor  Lungs: No use of accessory muscles, distant.  Occasional cough.  No wheezing  Cardiovascular: RRR, heart sounds normal, no murmur or gallops, no peripheral edema  Musculoskeletal: No deformities, no cyanosis or clubbing  Neuro: alert, awake, non focal  Skin: Warm, no lesions or rash      Assessment & Plan:   Bronchiectasis (HCC) She is not having significant mucus burden in her chest ever since she finished her levofloxacin.  She was unable to get a sputum sample.  Now improved.  She is not using flutter valve regularly.  Does average albuterol nebulizer about once a day.  Biggest complaint is sinus fullness.  Plan to continue her same regimen  We reviewed your CT scan of the chest today. Continue your generic Symbicort 2 puffs twice a day.  Rinse and gargle after using. Continue use your albuterol nebulizer when you need it. Use your flutter valve as needed to help clear mucus and secretions Call if you develop any increasing cough, shortness of breath, mucus production. Follow with APP in 6 months Follow Dr. Delton Coombes in 12  months or sooner if you have any problems.  Allergic rhinitis Chronic rhinitis contributing to sinus fullness, headache.  I think this is also increasing her overall mucus burden, probably will impact cough, adequate control of her bronchiectasis.  Plan to ramp up therapy at least during the allergy season.  Confirm that you are using your montelukast (generic Singulair) 10 mg each evening Restart your loratadine 10 mg (generic Claritin) once daily Try starting fluticasone nasal spray, 2 sprays each nostril once daily during the allergy season.   Levy Pupa, MD, PhD 08/03/2022, 10:40 AM [facility] [phone] or if no answer before 7:00PM call [phone] For any issues after 7:00PM please call eLink [phone]

## 2022-08-03 NOTE — Patient Instructions (Addendum)
We reviewed your CT scan of the chest today. Continue your generic Symbicort 2 puffs twice a day.  Rinse and gargle after using. Continue use your albuterol nebulizer when you need it. Use your flutter valve as needed to help clear mucus and secretions Confirm that you are using your montelukast (generic Singulair) 10 mg each evening Restart your loratadine 10 mg (generic Claritin) once daily Try starting fluticasone nasal spray, 2 sprays each nostril once daily during the allergy season. Call if you develop any increasing cough, shortness of breath, mucus production. Follow with APP in 6 months Follow Dr. Delton Coombes in 12 months or sooner if you have any problems.

## 2022-08-03 NOTE — Assessment & Plan Note (Signed)
She is not having significant mucus burden in her chest ever since she finished her levofloxacin.  She was unable to get a sputum sample.  Now improved.  She is not using flutter valve regularly.  Does average albuterol nebulizer about once a day.  Biggest complaint is sinus fullness.  Plan to continue her same regimen  We reviewed your CT scan of the chest today. Continue your generic Symbicort 2 puffs twice a day.  Rinse and gargle after using. Continue use your albuterol nebulizer when you need it. Use your flutter valve as needed to help clear mucus and secretions Call if you develop any increasing cough, shortness of breath, mucus production. Follow with APP in 6 months Follow Dr. Delton Coombes in 12 months or sooner if you have any problems.

## 2022-08-03 NOTE — Addendum Note (Signed)
Addended by: Dorisann Frames R on: 08/03/2022 10:55 AM   Modules accepted: Orders

## 2022-08-03 NOTE — Assessment & Plan Note (Signed)
Chronic rhinitis contributing to sinus fullness, headache.  I think this is also increasing her overall mucus burden, probably will impact cough, adequate control of her bronchiectasis.  Plan to ramp up therapy at least during the allergy season.  Confirm that you are using your montelukast (generic Singulair) 10 mg each evening Restart your loratadine 10 mg (generic Claritin) once daily Try starting fluticasone nasal spray, 2 sprays each nostril once daily during the allergy season.

## 2022-08-09 ENCOUNTER — Ambulatory Visit: Payer: Medicare Other | Admitting: Adult Health

## 2022-08-10 DIAGNOSIS — H10013 Acute follicular conjunctivitis, bilateral: Secondary | ICD-10-CM | POA: Diagnosis not present

## 2022-08-10 DIAGNOSIS — H401233 Low-tension glaucoma, bilateral, severe stage: Secondary | ICD-10-CM | POA: Diagnosis not present

## 2022-08-10 DIAGNOSIS — H35371 Puckering of macula, right eye: Secondary | ICD-10-CM | POA: Diagnosis not present

## 2022-08-10 DIAGNOSIS — H26492 Other secondary cataract, left eye: Secondary | ICD-10-CM | POA: Diagnosis not present

## 2022-08-19 ENCOUNTER — Telehealth: Payer: Self-pay | Admitting: Neurology

## 2022-08-19 NOTE — Telephone Encounter (Signed)
Spoke with pt daughter in law who is on the DPR informed her that she can take her Donepezil in the morning to see if it helps with sleep

## 2022-08-19 NOTE — Telephone Encounter (Signed)
Patient wants to know if she can take the Donepezil in the morning and not at night. She thinks it is messing with her sleep

## 2022-08-24 DIAGNOSIS — R21 Rash and other nonspecific skin eruption: Secondary | ICD-10-CM | POA: Diagnosis not present

## 2022-08-24 DIAGNOSIS — S71151A Open bite, right thigh, initial encounter: Secondary | ICD-10-CM | POA: Diagnosis not present

## 2022-08-24 DIAGNOSIS — R030 Elevated blood-pressure reading, without diagnosis of hypertension: Secondary | ICD-10-CM | POA: Diagnosis not present

## 2022-09-09 DIAGNOSIS — F4329 Adjustment disorder with other symptoms: Secondary | ICD-10-CM | POA: Diagnosis not present

## 2022-09-09 DIAGNOSIS — R5383 Other fatigue: Secondary | ICD-10-CM | POA: Diagnosis not present

## 2022-09-09 DIAGNOSIS — R634 Abnormal weight loss: Secondary | ICD-10-CM | POA: Diagnosis not present

## 2022-09-09 DIAGNOSIS — Z681 Body mass index (BMI) 19 or less, adult: Secondary | ICD-10-CM | POA: Diagnosis not present

## 2022-09-09 DIAGNOSIS — R030 Elevated blood-pressure reading, without diagnosis of hypertension: Secondary | ICD-10-CM | POA: Diagnosis not present

## 2022-09-09 DIAGNOSIS — E559 Vitamin D deficiency, unspecified: Secondary | ICD-10-CM | POA: Diagnosis not present

## 2022-09-21 DIAGNOSIS — D225 Melanocytic nevi of trunk: Secondary | ICD-10-CM | POA: Diagnosis not present

## 2022-09-21 DIAGNOSIS — D485 Neoplasm of uncertain behavior of skin: Secondary | ICD-10-CM | POA: Diagnosis not present

## 2022-09-21 DIAGNOSIS — Z85828 Personal history of other malignant neoplasm of skin: Secondary | ICD-10-CM | POA: Diagnosis not present

## 2022-09-21 DIAGNOSIS — Z08 Encounter for follow-up examination after completed treatment for malignant neoplasm: Secondary | ICD-10-CM | POA: Diagnosis not present

## 2022-09-21 DIAGNOSIS — Z1283 Encounter for screening for malignant neoplasm of skin: Secondary | ICD-10-CM | POA: Diagnosis not present

## 2022-10-04 DIAGNOSIS — R634 Abnormal weight loss: Secondary | ICD-10-CM | POA: Diagnosis not present

## 2022-10-04 DIAGNOSIS — W57XXXA Bitten or stung by nonvenomous insect and other nonvenomous arthropods, initial encounter: Secondary | ICD-10-CM | POA: Diagnosis not present

## 2022-10-04 DIAGNOSIS — S30860A Insect bite (nonvenomous) of lower back and pelvis, initial encounter: Secondary | ICD-10-CM | POA: Diagnosis not present

## 2022-10-04 DIAGNOSIS — R030 Elevated blood-pressure reading, without diagnosis of hypertension: Secondary | ICD-10-CM | POA: Diagnosis not present

## 2022-10-04 DIAGNOSIS — Z681 Body mass index (BMI) 19 or less, adult: Secondary | ICD-10-CM | POA: Diagnosis not present

## 2022-10-04 DIAGNOSIS — R110 Nausea: Secondary | ICD-10-CM | POA: Diagnosis not present

## 2022-10-04 DIAGNOSIS — F4329 Adjustment disorder with other symptoms: Secondary | ICD-10-CM | POA: Diagnosis not present

## 2022-10-07 ENCOUNTER — Encounter: Payer: Self-pay | Admitting: Gastroenterology

## 2022-10-10 DIAGNOSIS — F32A Depression, unspecified: Secondary | ICD-10-CM | POA: Diagnosis not present

## 2022-10-10 DIAGNOSIS — F4329 Adjustment disorder with other symptoms: Secondary | ICD-10-CM | POA: Diagnosis not present

## 2022-10-11 DIAGNOSIS — G3184 Mild cognitive impairment, so stated: Secondary | ICD-10-CM | POA: Diagnosis not present

## 2022-10-11 DIAGNOSIS — R634 Abnormal weight loss: Secondary | ICD-10-CM | POA: Diagnosis not present

## 2022-10-11 DIAGNOSIS — D72829 Elevated white blood cell count, unspecified: Secondary | ICD-10-CM | POA: Diagnosis not present

## 2022-10-11 DIAGNOSIS — K219 Gastro-esophageal reflux disease without esophagitis: Secondary | ICD-10-CM | POA: Diagnosis not present

## 2022-10-11 DIAGNOSIS — Z856 Personal history of leukemia: Secondary | ICD-10-CM | POA: Diagnosis not present

## 2022-10-24 DIAGNOSIS — R110 Nausea: Secondary | ICD-10-CM | POA: Diagnosis not present

## 2022-10-24 DIAGNOSIS — G3184 Mild cognitive impairment, so stated: Secondary | ICD-10-CM | POA: Diagnosis not present

## 2022-10-24 DIAGNOSIS — R053 Chronic cough: Secondary | ICD-10-CM | POA: Diagnosis not present

## 2022-10-24 DIAGNOSIS — R030 Elevated blood-pressure reading, without diagnosis of hypertension: Secondary | ICD-10-CM | POA: Diagnosis not present

## 2022-10-24 DIAGNOSIS — F4329 Adjustment disorder with other symptoms: Secondary | ICD-10-CM | POA: Diagnosis not present

## 2022-10-24 DIAGNOSIS — R5383 Other fatigue: Secondary | ICD-10-CM | POA: Diagnosis not present

## 2022-10-24 DIAGNOSIS — E785 Hyperlipidemia, unspecified: Secondary | ICD-10-CM | POA: Diagnosis not present

## 2022-10-24 DIAGNOSIS — Z681 Body mass index (BMI) 19 or less, adult: Secondary | ICD-10-CM | POA: Diagnosis not present

## 2022-11-24 DIAGNOSIS — H53423 Scotoma of blind spot area, bilateral: Secondary | ICD-10-CM | POA: Diagnosis not present

## 2022-11-24 DIAGNOSIS — H401233 Low-tension glaucoma, bilateral, severe stage: Secondary | ICD-10-CM | POA: Diagnosis not present

## 2022-11-30 DIAGNOSIS — R053 Chronic cough: Secondary | ICD-10-CM | POA: Diagnosis not present

## 2022-11-30 DIAGNOSIS — Z681 Body mass index (BMI) 19 or less, adult: Secondary | ICD-10-CM | POA: Diagnosis not present

## 2022-11-30 DIAGNOSIS — J3089 Other allergic rhinitis: Secondary | ICD-10-CM | POA: Diagnosis not present

## 2022-11-30 DIAGNOSIS — J019 Acute sinusitis, unspecified: Secondary | ICD-10-CM | POA: Diagnosis not present

## 2022-11-30 DIAGNOSIS — R030 Elevated blood-pressure reading, without diagnosis of hypertension: Secondary | ICD-10-CM | POA: Diagnosis not present

## 2022-12-07 DIAGNOSIS — M791 Myalgia, unspecified site: Secondary | ICD-10-CM | POA: Diagnosis not present

## 2022-12-07 DIAGNOSIS — R030 Elevated blood-pressure reading, without diagnosis of hypertension: Secondary | ICD-10-CM | POA: Diagnosis not present

## 2022-12-07 DIAGNOSIS — J40 Bronchitis, not specified as acute or chronic: Secondary | ICD-10-CM | POA: Diagnosis not present

## 2022-12-07 DIAGNOSIS — Z681 Body mass index (BMI) 19 or less, adult: Secondary | ICD-10-CM | POA: Diagnosis not present

## 2022-12-21 ENCOUNTER — Encounter: Payer: Self-pay | Admitting: Neurology

## 2022-12-21 ENCOUNTER — Ambulatory Visit (INDEPENDENT_AMBULATORY_CARE_PROVIDER_SITE_OTHER): Payer: Medicare Other | Admitting: Neurology

## 2022-12-21 VITALS — BP 120/62 | HR 66 | Ht 60.0 in | Wt 98.6 lb

## 2022-12-21 DIAGNOSIS — R55 Syncope and collapse: Secondary | ICD-10-CM | POA: Diagnosis not present

## 2022-12-21 DIAGNOSIS — G3184 Mild cognitive impairment, so stated: Secondary | ICD-10-CM | POA: Diagnosis not present

## 2022-12-21 MED ORDER — RIVASTIGMINE TARTRATE 1.5 MG PO CAPS: 1.5000 mg | ORAL_CAPSULE | Freq: Two times a day (BID) | ORAL | 6 refills | Status: DC

## 2022-12-21 NOTE — Patient Instructions (Signed)
Good to see you.  Stop the Donepezil. See how you feel over the next several days and if the nausea and frequent bowel movements stop, no need to see the GI specialist  2. You can start the new medication Rivastigmine 1.5mg  twice a day next week. Update me in a month, if no issues, we will plan to increase dose to 3mg  twice a day  3. Follow-up in 4 months, call for any changes   FALL PRECAUTIONS: Be cautious when walking. Scan the area for obstacles that may increase the risk of trips and falls. When getting up in the mornings, sit up at the edge of the bed for a few minutes before getting out of bed. Consider elevating the bed at the head end to avoid drop of blood pressure when getting up. Walk always in a well-lit room (use night lights in the walls). Avoid area rugs or power cords from appliances in the middle of the walkways. Use a walker or a cane if necessary and consider physical therapy for balance exercise. Get your eyesight checked regularly.  FINANCIAL OVERSIGHT: Supervision, especially oversight when making financial decisions or transactions is also recommended as difficulties arise.  HOME SAFETY: Consider the safety of the kitchen when operating appliances like stoves, microwave oven, and blender. Consider having supervision and share cooking responsibilities until no longer able to participate in those. Accidents with firearms and other hazards in the house should be identified and addressed as well.  DRIVING: Regarding driving, in patients with progressive memory problems, driving will be impaired. We advise to have someone else do the driving if trouble finding directions or if minor accidents are reported. Independent driving assessment is available to determine safety of driving.  ABILITY TO BE LEFT ALONE: If patient is unable to contact 911 operator, consider using LifeLine, or when the need is there, arrange for someone to stay with patients. Smoking is a fire hazard, consider  supervision or cessation. Risk of wandering should be assessed by caregiver and if detected at any point, supervision and safe proof recommendations should be instituted.  MEDICATION SUPERVISION: Inability to self-administer medication needs to be constantly addressed. Implement a mechanism to ensure safe administration of the medications.  RECOMMENDATIONS FOR ALL PATIENTS WITH MEMORY PROBLEMS: 1. Continue to exercise (Recommend 30 minutes of walking everyday, or 3 hours every week) 2. Increase social interactions - continue going to [place] and enjoy social gatherings with friends and family 3. Eat healthy, avoid fried foods and eat more fruits and vegetables 4. Maintain adequate blood pressure, blood sugar, and blood cholesterol level. Reducing the risk of stroke and cardiovascular disease also helps promoting better memory. 5. Avoid stressful situations. Live a simple life and avoid aggravations. Organize your time and prepare for the next day in anticipation. 6. Sleep well, avoid any interruptions of sleep and avoid any distractions in the bedroom that may interfere with adequate sleep quality 7. Avoid sugar, avoid sweets as there is a strong link between excessive sugar intake, diabetes, and cognitive impairment The Mediterranean diet has been shown to help patients reduce the risk of progressive memory disorders and reduces cardiovascular risk. This includes eating fish, eat fruits and green leafy vegetables, nuts like almonds and hazelnuts, walnuts, and also use olive oil. Avoid fast foods and fried foods as much as possible. Avoid sweets and sugar as sugar use has been linked to worsening of memory function.  There is always a concern of gradual progression of memory problems. If this is the  case, then we may need to adjust level of care according to patient needs. Support, both to the patient and caregiver, should then be put into place.

## 2022-12-21 NOTE — Progress Notes (Signed)
NEUROLOGY FOLLOW UP OFFICE NOTE  [name] [id number] [birth year]  HISTORY OF PRESENT ILLNESS: I had the pleasure of seeing Jenna Rasmussen in follow-up in the neurology clinic on 12/21/2022.  The patient was last seen 6 months ago for syncope and memory loss. She is again accompanied by her daughter-in-law [name] who helps supplement the history today.  Records and images were personally reviewed where available.  Since her last visit, she underwent Neuropsychological testing in 07/2022 with prominent impairment surrounding both semantic fluency and all aspects of learning and memory, she denied any difficulties functionally and was diagnosed with Mild Neurocognitive Disorder, with concern for underlying etiology of Alzheimer's disease worsened by cerebrovascular factors (mixed presentation). Brain MRI in 11/2021 showed moderately severe chronic microvascular disease, no acute changes. She was started on Donepezil 10mg  but started having nausea and frequent BMs so she has been taking only 1/2 tablet daily but with continued symptoms. She had been referred to GI due to this.  She feels memory is okay. [name] agrees that she has been overall stable/plateaued. She looks to [facility] when asked questions and needs repeated explanations. No further syncopal episodes since 01/2022 so she has been back to driving locally. She did go on the wrong street for her Dermatology appointment in May and missed her appointment twice, she went with her daughter the next time. Her family was previously filling her pillbox but she prefers to take them out of the bottle and writes down when she takes them. She denies missing any bill payments. She is independent with dressing and bathing. No hygiene concerns. No staring/unresponsive episodes. She has had very few spells of vertigo, there was one time she remembers that she stayed in bed and took meclizine but does not recall having a spinning sensation. No falls. She lives alone, her  family lives close by. She does not sleep well sometimes, her best time is at 9pm.    History on Initial Assessment 02/16/2022: This is a pleasant 77 year old right-handed woman with a history of hyperlipidemia, CLL in remission, presenting for evaluation of syncope and memory loss. They report that after she had Covid in 03/2021, she has been complaining of generally feeling unwell since. She would say "I can feel it in my head." Previously, she had lost her husband in June 2021 and had episodes of vertigo affecting daily activities, spending her day in bed due to anxiety of having a fall, then she had endolymphatic surgery in 11/2020 and the vertigo resolved completely. She however has had underlying anxiety/worry when she got Covid and had seen Pulmonary thinking she had an infection but did not feel any better. On 11/29/21, she was a passenger in the car and told her daughter she was not feeling well, asking to be taken home and that she needed to go to the bathroom. She then passed out and woke up to EMS around her. Her daughter reported her head went back, eyes rolled back and arms flexed, she was rigid and had shaking, making a very vocal sound ("gurgling moan"), then she was just moaning after. This lasted less than 3 minutes, she was waking up as EMS opened her door. No tongue bite or incontinence. She does nor recall feeling dizzy, no headache, focal numbness/tingling/weakness. She was very sleepy when she got to the hospital, she was nauseated and vomited in the hospital. She was admitted overnight at [facility] where brain MRI without contrast showed chronic microvascular changes moderately severe in the  cerebral white matter, progressed from prior MRI done 04/2020. 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 There was mild generalized cerebral atrophy. Carotid dopplers no significant stenosis, echocardiogram was normal. Zio patch did not  show any sustained arrhythmia or prolonged pauses, occasional PVCs and short SV/V runs were present. She was seen by Cardiology in follow-up. On 02/03/22, she had another syncopal episode. She was with her realtor looking at a house, she recalls coming out and felt like she missed a step. She said she would just sit for a minute, then fel like she was getting lightheaded with the world fading. The realtor reported she was stepping down steps and sat down hard on the ground. Realtor helped her lean against the wall and asked her questions that she was able to answer. Her realtor went in to get a rag and could tell she was going to fall over so she sat next to her as she briefly passed out with a little bit of jerking. She threw up when EMS arrived and faded out again with EMS, saying she wanted to go home. She was pale with note of HR was in the upper 30s/low 40s. In the ER, HR was in the 50s. She was seen by a different cardiologist and a tilt table test has been ordered. No further syncopal episodes since then.  Family has also been concerned about her memory. They started noticing some confusion, repeating herself, since her husband passed away in 01/24/20. At that time, she was taking Xanax. It was stopped and symptoms got better but did not go away completely. She lives alone. She denies missing medications or bill payments. [name] notes though that she could not tell family exactly what medication she took or when she took it. She was previously driving and denied getting lost driving. She denies leaving the stove on. Her paternal grandmother and several aunts had dementia. In January 2023, [name] recalls a strange episode. As previously mentioned, she had been feeling bad since her Covid infection the year prior, they were at a family gathering when she became very distant/removed, not answering when people asked questions but looking at them. She told them she was not feeling well with a headache, got up, then  lay on the floor of her bedroom, which is unusual. She went to sleep on the floor for 30-45 minutes. Family has not noticed any staring/unresponsive episodes. She denies any other gaps in time. She denies any olfactory/gustatory hallucinations, deja vu, rising epigastric sensation, focal numbness/tingling/weakness, myoclonic jerks. Prior to her Covid infection, she did not have a history of headaches. Since then, she has had headaches or complains of her head not feeling right 5 out of 7 days in a week. She takes Tylenol which does not help, weather changes seem to affect her. She states something "feels heavy and does not feel normal." She describes a pressure in her head, she can still do her daily activities but family has noticed she does not do her chores as often, or making bread/going to grocery, like before. She denies any dizziness, diplopia, dysarthria/dysphagia, neck/back pain, bowel/bladder dysfunction, anosmia, or tremors. Her nephew has seizures. She had a normal birth and early development.  There is no history of febrile convulsions, CNS infections such as meningitis/encephalitis, significant traumatic brain injury, neurosurgical procedures.  Bloodwork at PCP office in 01/23/22 showed an LDL of 58, total cholesterol of 137. Bloodwork in 05/2021 showed a normal B12  of 381, normal TSH.   EEG in 02/2022 normal.   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  We discussed repeat injection today and she is interested in pursuing this on a more regular basis to control her symptoms 2.  Steroid injection placed right CMC joint 20 mg Depo-Medrol 1/2 cc 1% Xylocaine with bicarb after Betadine skin prep well-tolerated 3.  Follow-up in 4 to 58-month intervals f   Pseudophakia of both eyes 02/15/2021   Seizure-like activity (HCC) 11/29/2021   Syncope and collapse 02/11/2022    MEDICATIONS: Current Outpatient Medications on File Prior to Visit  Medication Sig Dispense Refill   albuterol (PROVENTIL) (2.5 MG/3ML) 0.083% nebulizer solution Take 2.5 mg by nebulization 2 (two) times daily.     alendronate (FOSAMAX) 70 MG tablet Take 70 mg by mouth once a week.     arformoterol (BROVANA) 15 MCG/2ML NEBU Take 2 mLs (15 mcg total) by nebulization 2 (two) times daily. 120 mL 6   aspirin 81 MG chewable tablet Chew 81 mg by mouth daily.     atorvastatin (LIPITOR) 40 MG tablet Take 40 mg by mouth daily.     b complex vitamins capsule Take 1 capsule by mouth daily.     budesonide-formoterol (SYMBICORT) 160-4.5 MCG/ACT inhaler Inhale 2 puffs into the lungs 2 (two) times daily. 1 each 6   calcium-vitamin D (OSCAL WITH D) 500-200 MG-UNIT tablet Take 1 tablet by mouth daily.     co-enzyme Q-10 30 MG capsule Take by mouth.     donepezil (ARICEPT) 10 MG tablet Take 1/2 tablet daily for 2 weeks, then increase to 1 tablet daily and continue 30 tablet 6   dorzolamide-timolol (COSOPT) 2-0.5 % ophthalmic solution Place 1 drop into both eyes 2 (two) times daily.     fluticasone (FLONASE) 50 MCG/ACT nasal spray Place 2 sprays into both nostrils daily. 16 g 2   ibuprofen (ADVIL) 200 MG tablet Take 200 mg by mouth every 8 (eight) hours as needed.     latanoprost  (XALATAN) 0.005 % ophthalmic solution Place 1 drop into both eyes at bedtime.     levofloxacin (LEVAQUIN) 500 MG tablet Take 1 tablet (500 mg total) by mouth daily. 7 tablet 0   loratadine (CLARITIN) 5 MG chewable tablet Chew 5 mg by mouth daily.     meclizine (ANTIVERT) 25 MG tablet Take 25 mg by mouth 3 (three) times daily as needed for dizziness.     montelukast (SINGULAIR) 10 MG tablet Take 10 mg by mouth daily. (Patient not taking: Reported on 08/03/2022)     pantoprazole (PROTONIX) 40 MG tablet Take 40 mg by mouth daily.     No current facility-administered medications on file prior to visit.    ALLERGIES: Allergies  Allergen Reactions  Rifampin     Liver problems and patient ended up in hospital   Codeine     Dizziness stomach upset   Flagyl [Metronidazole Hcl]     rash    FAMILY HISTORY: Family History  Problem Relation Age of Onset   Heart disease Father    Heart attack Father    Hyperlipidemia Father    Dementia Paternal Aunt        several aunts w/dementia and concerns for Alzheimer's disease; onset likely early 51s    SOCIAL HISTORY: Social History   Socioeconomic History   Marital status: Widowed    Spouse name: Not on file   Number of children: Not on file   Years of education: 12   Highest education level: High school graduate  Occupational History   Occupation: Retired    Comment: Investment banker, corporate  Tobacco Use   Smoking status: Former    Current packs/day: 0.00    Types: Cigarettes    Quit date: 04/12/1983    Years since quitting: 39.7   Smokeless tobacco: Never   Tobacco comments:    smoked x 20 yrs  Vaping Use   Vaping status: Never Used  Substance and Sexual Activity   Alcohol use: Never   Drug use: Never   Sexual activity: Not on file  Other Topics Concern   Not on file  Social History Narrative   Are you right handed or left handed? Right handed   Are you currently employed ? Retired    What is your current occupation? NA   Do you live at  home alone? alone   Who lives with you? NA   What type of home do you live in: 1 story or 2 story? 1 story        Social Determinants of Corporate investment banker Strain: Not on file  Food Insecurity: Not on file  Transportation Needs: Not on file  Physical Activity: Not on file  Stress: Not on file  Social Connections: Not on file  Intimate Partner Violence: Not on file     PHYSICAL EXAM: Vitals:   12/21/22 0948  BP: 120/62  Pulse: 66  SpO2: 96%   General: No acute distress Head:  Normocephalic/atraumatic Skin/Extremities: No rash, no edema Neurological Exam: alert and awake. No aphasia or dysarthria. Fund of knowledge is appropriate.  Recent and remote memory are impaired, she keeps looking to her daughter-in-law and needs repeated explanations/instructions.  Attention and concentration are reduced.   Cranial nerves: Pupils equal, round. Extraocular movements intact with no nystagmus. Visual fields full.  No facial asymmetry.  Motor: Bulk and tone normal, muscle strength 5/5 throughout with no pronator drift.   Finger to nose testing intact.  Gait narrow-based and steady, no ataxia. Negative Romberg test.   IMPRESSION: This is a pleasant 77 yo RH woman with a history of hyperlipidemia, CLL in remission, with syncope and memory loss. She had 2 episodes of loss of consciousness that occurred 11/2021 and 01/2022 with report of stiffening and shaking with the first episode, bradycardia. MRI brain no acute changes, there was moderate chronic microvascular disease and note of a lacunar infarct that was not seen on imaging from a year ago. She has had an extensive cardiac workup, no arrhythmia seen on holter. Her routine EEG is normal. No further episodes since 01/2022. 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 She had GI side effects on Donepezil. We  discussed holding Donepezil over the  next several days, if no improvement in GI symptoms, proceed with GI workup. She will plan to start Rivastigmine 1.5mg  BID next week, side effects and expectations discussed. If no side effects in a month, we will increase to 3mg  BID. Continue close supervision, particularly with driving. Follow-up in 4 months, call for any changes.   Thank you for allowing me to participate in her care.  Please do not hesitate to call for any questions or concerns.   Patrcia Dolly, M.D.   CC: Lawerance Sabal, [place]

## 2022-12-27 ENCOUNTER — Ambulatory Visit: Payer: Medicare Other | Admitting: Gastroenterology

## 2022-12-30 DIAGNOSIS — J40 Bronchitis, not specified as acute or chronic: Secondary | ICD-10-CM | POA: Diagnosis not present

## 2022-12-30 DIAGNOSIS — R634 Abnormal weight loss: Secondary | ICD-10-CM | POA: Diagnosis not present

## 2022-12-30 DIAGNOSIS — G3184 Mild cognitive impairment, so stated: Secondary | ICD-10-CM | POA: Diagnosis not present

## 2022-12-30 DIAGNOSIS — Z681 Body mass index (BMI) 19 or less, adult: Secondary | ICD-10-CM | POA: Diagnosis not present

## 2022-12-30 DIAGNOSIS — R030 Elevated blood-pressure reading, without diagnosis of hypertension: Secondary | ICD-10-CM | POA: Diagnosis not present

## 2022-12-30 DIAGNOSIS — R053 Chronic cough: Secondary | ICD-10-CM | POA: Diagnosis not present

## 2022-12-30 DIAGNOSIS — R5383 Other fatigue: Secondary | ICD-10-CM | POA: Diagnosis not present

## 2022-12-30 DIAGNOSIS — C9110 Chronic lymphocytic leukemia of B-cell type not having achieved remission: Secondary | ICD-10-CM | POA: Diagnosis not present

## 2022-12-30 DIAGNOSIS — J3089 Other allergic rhinitis: Secondary | ICD-10-CM | POA: Diagnosis not present

## 2023-01-13 ENCOUNTER — Telehealth: Payer: Self-pay | Admitting: Emergency Medicine

## 2023-01-13 NOTE — Telephone Encounter (Signed)
[name] daughter would like patient to do a sputum culture. Would like to take to [facility] lab. [name] phone number is [phone].

## 2023-01-17 ENCOUNTER — Encounter: Payer: Self-pay | Admitting: Neurology

## 2023-01-20 NOTE — Telephone Encounter (Signed)
Called and spoke with pt's daughter letting her know that pt should be able to take a sample to [facility] if she is able to produce one since she does live closer to them. [name] verbalized understanding. Nothing further needed.

## 2023-02-01 ENCOUNTER — Ambulatory Visit (INDEPENDENT_AMBULATORY_CARE_PROVIDER_SITE_OTHER): Payer: Medicare Other | Admitting: Adult Health

## 2023-02-01 ENCOUNTER — Encounter: Payer: Self-pay | Admitting: Adult Health

## 2023-02-01 VITALS — BP 132/64 | HR 68 | Temp 97.8°F | Ht 62.0 in | Wt 99.4 lb

## 2023-02-01 DIAGNOSIS — J301 Allergic rhinitis due to pollen: Secondary | ICD-10-CM | POA: Diagnosis not present

## 2023-02-01 DIAGNOSIS — J471 Bronchiectasis with (acute) exacerbation: Secondary | ICD-10-CM

## 2023-02-01 MED ORDER — AMOXICILLIN-POT CLAVULANATE 875-125 MG PO TABS: 1.0000 | ORAL_TABLET | Freq: Two times a day (BID) | ORAL | 0 refills | Status: DC

## 2023-02-01 NOTE — Patient Instructions (Addendum)
Begin Augmentin 875mg  Twice daily  for 1 week.  Robitussin  1 tsp every 4hr as needed for congestion  Continue on Flutter valve Twice daily   Continue on Symbicort 2 puffs Twice daily. Rinse after use.  Albuterol neb As needed   Follow up with Dr. Delton Coombes  in 6 months  Please contact office for sooner follow up if symptoms do not improve or worsen or seek emergency care

## 2023-02-01 NOTE — Progress Notes (Unsigned)
@Patient  ID: Jenna Rasmussen, female    DOB: Jul 27, 1945, 77 y.o.   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  02/01/2023 Follow up : Bronchiectasis , CR  Patient presents for 56-month follow-up.  Patient has longstanding bronchiectasis.  History of MAIC.  Also colonized with MSSA, Acinetobacter, stenotrophomonas.  She is maintained on Symbicort twice daily.  She is to use flutter valve twice daily. Overall says her breathing is doing well until 2 weeks ago.  Can not give sputum culture.   Remains active. Lives at home , drives. Does light house chores.  Remains on Singulair daily. 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   Bronchiectasis (HCC) 06/17/2021   Cerebrovascular disease    Moderately-severe per 2023 brain MRI   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  We discussed repeat injection today and she is interested in pursuing this on a more regular basis to control her symptoms 2.  Steroid injection placed right CMC joint 20 mg Depo-Medrol 1/2 cc 1% Xylocaine with bicarb after Betadine skin prep well-tolerated 3.  Follow-up in 4 to 71-month intervals f   Pseudophakia of both eyes 02/15/2021   Seizure-like activity (HCC) 11/29/2021   Syncope and collapse 02/11/2022    Tobacco History: Social History   Tobacco Use  Smoking Status Former   Current packs/day: 0.00   Types: Cigarettes   Quit date: 04/12/1983   Years since quitting: 39.8  Smokeless Tobacco Never  Tobacco Comments   smoked x 20 yrs   Counseling given: Not Answered Tobacco comments: smoked x 20 yrs   Outpatient Medications Prior to Visit  Medication Sig Dispense Refill  albuterol (PROVENTIL) (2.5 MG/3ML) 0.083% nebulizer solution Take 2.5 mg by nebulization 2 (two) times daily.     alendronate (FOSAMAX) 70 MG tablet Take 70 mg by mouth once a week.     aspirin 81 MG chewable tablet Chew 81  mg by mouth daily.     atorvastatin (LIPITOR) 40 MG tablet Take 40 mg by mouth daily.     b complex vitamins capsule Take 1 capsule by mouth daily.     budesonide-formoterol (SYMBICORT) 160-4.5 MCG/ACT inhaler Inhale 2 puffs into the lungs 2 (two) times daily. 1 each 6   calcium-vitamin D (OSCAL WITH D) 500-200 MG-UNIT tablet Take 1 tablet by mouth daily.     co-enzyme Q-10 30 MG capsule Take by mouth.     dorzolamide-timolol (COSOPT) 2-0.5 % ophthalmic solution Place 1 drop into both eyes 2 (two) times daily.     ibuprofen (ADVIL) 200 MG tablet Take 200 mg by mouth every 8 (eight) hours as needed.     latanoprost (XALATAN) 0.005 % ophthalmic solution Place 1 drop into both eyes at bedtime.     loratadine (CLARITIN) 5 MG chewable tablet Chew 5 mg by mouth daily.     meclizine (ANTIVERT) 25 MG tablet Take 25 mg by mouth 3 (three) times daily as needed for dizziness.     montelukast (SINGULAIR) 10 MG tablet Take 10 mg by mouth daily.     pantoprazole (PROTONIX) 40 MG tablet Take 40 mg by mouth daily.     fluticasone (FLONASE) 50 MCG/ACT nasal spray Place 2 sprays into both nostrils daily. (Patient not taking: Reported on 02/01/2023) 16 g 2   rivastigmine (EXELON) 1.5 MG capsule Take 1 capsule (1.5 mg total) by mouth 2 (two) times daily. (Patient not taking: Reported on 02/01/2023) 60 capsule 6   No facility-administered medications prior to visit.     Review of Systems:   Constitutional:   No  weight loss, night sweats,  Fevers, chills, fatigue, or  lassitude.  HEENT:   No headaches,  Difficulty swallowing,  Tooth/dental problems, or  Sore throat,                No sneezing, itching, ear ache, nasal congestion, post nasal drip,   CV:  No chest pain,  Orthopnea, PND, swelling in lower extremities, anasarca, dizziness, palpitations, syncope.   GI  No heartburn, indigestion, abdominal pain, nausea, vomiting, diarrhea, change in bowel habits, loss of appetite, bloody stools.   Resp: No  shortness of breath with exertion or at rest.  No excess mucus, no productive cough,  No non-productive cough,  No coughing up of blood.  No change in color of mucus.  No wheezing.  No chest wall deformity  Skin: no rash or lesions.  GU: no dysuria, change in color of urine, no urgency or frequency.  No flank pain, no hematuria   MS:  No joint pain or swelling.  No decreased range of motion.  No back pain.    Physical Exam  BP 132/64 (BP Location: Left Arm, Cuff Size: Normal)   Pulse 68   Temp 97.8 F (36.6 C) (Temporal)   Ht 5\' 2"  (1.575 m)   Wt 99 lb 6.4 oz (45.1 kg)   LMP  (LMP Unknown)   SpO2 98%   BMI 18.18 kg/m   GEN: A/Ox3; pleasant , NAD, well nourished    HEENT:  [place]/AT,  EACs-clear, TMs-wnl, NOSE-clear, THROAT-clear, no lesions, no postnasal drip or exudate noted.   NECK:  Supple w/ fair ROM; no JVD; normal carotid impulses w/o bruits; no thyromegaly or nodules palpated; no lymphadenopathy.    RESP  Clear  P & A; w/o, wheezes/ rales/ or rhonchi. no accessory muscle use, no dullness to percussion  CARD:  RRR, no m/r/g, no peripheral edema, pulses intact, no cyanosis or clubbing.  GI:   Soft & nt; nml bowel sounds; no organomegaly or masses detected.   Musco: Warm bil, no deformities or joint swelling noted.   Neuro: alert, no focal deficits noted.    Skin: Warm, no lesions or rashes    Lab Results:  CBC    Component Value Date/Time   WBC 13.4 (H) 06/22/2022 1220   RBC 4.22 06/22/2022 1220   HGB 12.6 06/22/2022 1220   HCT 38.4 06/22/2022 1220   PLT 324.0 06/22/2022 1220   MCV 91.1 06/22/2022 1220   MCHC 32.8 06/22/2022 1220   RDW 13.6 06/22/2022 1220   LYMPHSABS 1.9 06/22/2022 1220   MONOABS 1.1 (H) 06/22/2022 1220   EOSABS 0.3 06/22/2022 1220   BASOSABS 0.1 06/22/2022 1220    BMET    Component Value Date/Time   NA 137 06/22/2022 1220   K 3.3 (L) 06/22/2022 1220   CL 101 06/22/2022 1220   CO2 29 06/22/2022 1220   GLUCOSE 62 (L) 06/22/2022  1220   BUN 12 06/22/2022 1220   CREATININE 0.90 06/22/2022 1220   CALCIUM 9.0 06/22/2022 1220   GFRNONAA >60 11/11/2020 1004    BNP No results found for: "BNP"  ProBNP No results found for: "PROBNP"  Imaging: No results found.  Administration History     None          Latest Ref Rng & Units 07/23/2021   10:06 AM  PFT Results  FVC-Pre L 2.31   FVC-Predicted Pre % 82   FVC-Post L 2.29   FVC-Predicted Post % 81   Pre FEV1/FVC % % 57   Post FEV1/FCV % % 51   FEV1-Pre L 1.31   FEV1-Predicted Pre % 62   FEV1-Post L 1.17   DLCO uncorrected ml/min/mmHg 9.99   DLCO UNC% % 52   DLCO corrected ml/min/mmHg 9.99   DLCO COR %Predicted % 52   DLVA Predicted % 80   TLC L 5.50   TLC % Predicted % 108   RV % Predicted % 142     No results found for: "NITRICOXIDE"      Assessment & Plan:   No problem-specific Assessment & Plan notes found for this encounter.     Rubye Oaks, NP 02/01/2023

## 2023-02-02 ENCOUNTER — Ambulatory Visit: Payer: Medicare Other | Admitting: Adult Health

## 2023-02-02 NOTE — Assessment & Plan Note (Signed)
Continue on current regimen .   

## 2023-02-02 NOTE — Assessment & Plan Note (Signed)
Acute bronchiectatic exacerbation.  Will treat with Augmentin x 7 days.  Check sputum culture. Continue with mucociliary clearance  Plan  Patient Instructions  Begin Augmentin 875mg  Twice daily  for 1 week.  Robitussin  1 tsp every 4hr as needed for congestion  Continue on Flutter valve Twice daily   Continue on Symbicort 2 puffs Twice daily. Rinse after use.  Albuterol neb As needed   Follow up with Dr. Delton Coombes  in 6 months  Please contact office for sooner follow up if symptoms do not improve or worsen or seek emergency care

## 2023-02-08 ENCOUNTER — Telehealth: Payer: Self-pay | Admitting: Adult Health

## 2023-02-08 DIAGNOSIS — J471 Bronchiectasis with (acute) exacerbation: Secondary | ICD-10-CM

## 2023-02-09 MED ORDER — AMOXICILLIN-POT CLAVULANATE 875-125 MG PO TABS: 1.0000 | ORAL_TABLET | Freq: Two times a day (BID) | ORAL | 0 refills | Status: DC

## 2023-02-09 NOTE — Telephone Encounter (Signed)
Pt calling back stating she still has heard nothing

## 2023-02-09 NOTE — Telephone Encounter (Signed)
Has been on multiple antibiotics , needs to leave sputum culture and bring to lab as we discussed at OV.   Will extend Augmentin for additional 3 days . Rx sent to pharm.   Will need ov with chest xray .   Please contact office for sooner follow up if symptoms do not improve or worsen or seek emergency care

## 2023-02-09 NOTE — Telephone Encounter (Signed)
Pt states her cough has became worse since her LOV 10/23. Pt is finished with her antibiotic. Requesting for something to be sent in. Pharmacy [facility]

## 2023-02-14 ENCOUNTER — Other Ambulatory Visit (HOSPITAL_COMMUNITY)
Admission: RE | Admit: 2023-02-14 | Discharge: 2023-02-14 | Disposition: A | Discharge status: Home / Self Care | Payer: Medicare Other | Source: Ambulatory Visit | Attending: Adult Health | Admitting: Adult Health

## 2023-02-14 DIAGNOSIS — J471 Bronchiectasis with (acute) exacerbation: Secondary | ICD-10-CM | POA: Diagnosis not present

## 2023-02-14 DIAGNOSIS — H401233 Low-tension glaucoma, bilateral, severe stage: Secondary | ICD-10-CM | POA: Diagnosis not present

## 2023-02-16 LAB — CULTURE, RESPIRATORY W GRAM STAIN
Culture: NORMAL
Culture: NORMAL
Gram Stain: NONE SEEN

## 2023-02-28 ENCOUNTER — Encounter: Payer: Self-pay | Admitting: Emergency Medicine

## 2023-02-28 DIAGNOSIS — R030 Elevated blood-pressure reading, without diagnosis of hypertension: Secondary | ICD-10-CM | POA: Diagnosis not present

## 2023-02-28 DIAGNOSIS — J019 Acute sinusitis, unspecified: Secondary | ICD-10-CM | POA: Diagnosis not present

## 2023-02-28 DIAGNOSIS — J479 Bronchiectasis, uncomplicated: Secondary | ICD-10-CM | POA: Diagnosis not present

## 2023-02-28 DIAGNOSIS — Z681 Body mass index (BMI) 19 or less, adult: Secondary | ICD-10-CM | POA: Diagnosis not present

## 2023-02-28 DIAGNOSIS — R059 Cough, unspecified: Secondary | ICD-10-CM | POA: Diagnosis not present

## 2023-03-06 ENCOUNTER — Other Ambulatory Visit: Payer: Self-pay | Admitting: Nurse Practitioner

## 2023-03-07 NOTE — Telephone Encounter (Signed)
Pt completed sputum sample, TP read the results as negative. Attempted to call pt, no answer.

## 2023-03-08 NOTE — Telephone Encounter (Signed)
Dr Delton Coombes- please advise, thanks!  I met with my prescription drug plan provider today regarding my prescription plan for next year. I was advised that the Budesonide inhaler I am currently using would no longer be covered by my plan beginning April 12, 2023. However, they will cover the Budesonide medication in the nebulizer. Is this something you would be willing to switch me to in place of the inhaler?

## 2023-03-08 NOTE — Telephone Encounter (Signed)
There are other good [employer] HFA's that we could choose from if we know her formulary. I'd look for an HFA alternative before switching to nebs unless she strongly feels a neb would be better. She needs to get [place] her formulary or we can run a test case with pharmacy

## 2023-03-15 NOTE — Telephone Encounter (Signed)
Okay with me to prescribe budesonide solution via nebulizer twice daily

## 2023-03-17 ENCOUNTER — Other Ambulatory Visit: Payer: Self-pay | Admitting: Emergency Medicine

## 2023-03-28 MED ORDER — BUDESONIDE 0.5 MG/2ML IN SUSP: 0.5000 mg | Freq: Two times a day (BID) | RESPIRATORY_TRACT | 1 refills | Status: DC

## 2023-03-28 NOTE — Addendum Note (Signed)
Addended by: Jama Flavors on: 03/28/2023 08:36 AM   Modules accepted: Orders

## 2023-04-19 ENCOUNTER — Telehealth: Payer: Self-pay

## 2023-04-19 NOTE — Telephone Encounter (Signed)
 Pt daughter called wanted Dr Georjean to be aware of some changes going on before her appointment next week. Around Thanksgiving Pt was sick and calling different doctors asking for Antibiotics for her lungs her PCP put her on Prozac for depression. The pt family is doing her medication for her and she is ok with them doing her medication. Pt still has her drivers license but she is not driving as much she has gotten lost a few times, she has had a few cognitive changes. Pt is still paying her own bills but family is monitoring her. Family has noticed that she talks more about the past and she ask repetitive questions at times they will answer her and she will move on. Family is asking if she needs repeat testing?

## 2023-04-19 NOTE — Telephone Encounter (Signed)
 It does sound like progression of disease, we can certainly repeat memory testing, but let me evaluate her first with office screening memory test. If it is quite clear on her appointment next week, we may not need to repeat the Neuropsych testing. Thanks

## 2023-04-26 ENCOUNTER — Telehealth: Payer: Self-pay

## 2023-04-26 ENCOUNTER — Other Ambulatory Visit: Payer: Medicare Other

## 2023-04-26 ENCOUNTER — Encounter: Payer: Self-pay | Admitting: Neurology

## 2023-04-26 ENCOUNTER — Ambulatory Visit: Payer: Medicare Other | Admitting: Neurology

## 2023-04-26 VITALS — BP 116/61 | HR 80 | Ht 62.0 in | Wt 94.8 lb

## 2023-04-26 DIAGNOSIS — R55 Syncope and collapse: Secondary | ICD-10-CM

## 2023-04-26 DIAGNOSIS — G3184 Mild cognitive impairment, so stated: Secondary | ICD-10-CM | POA: Diagnosis not present

## 2023-04-26 MED ORDER — RIVASTIGMINE TARTRATE 3 MG PO CAPS: 3.0000 mg | ORAL_CAPSULE | Freq: Two times a day (BID) | ORAL | 3 refills | Status: DC

## 2023-04-26 NOTE — Progress Notes (Signed)
 NEUROLOGY FOLLOW UP OFFICE NOTE  Jenna Rasmussen [id number] 08/22/1945  HISTORY OF PRESENT ILLNESS: I had the pleasure of seeing Jenna Rasmussen in follow-up in the neurology clinic on 04/26/2023.  The patient was last seen 4 months ago for syncope and memory loss. She is again accompanied by her daughter-in-law Jenna Rasmussen who helps supplement the history today.  Records and images were personally reviewed where available.  Jenna Rasmussen had called our office before the visit to report that around Thanksgiving, she was sick and calling different doctors for antibiotics for her lungs. Her PCP started Prozac 10mg  daily for depression. This constant focus on her lungs has gotten better. Family fills her box and she takes them, but sometimes would miss 3 days in a week on average attributed to nausea. If she is not feeling good, she does not feel like taking her medication due to fear of worsening the nausea. Jenna Rasmussen reports that she did have nausea with Donepezil , this has improved, and for a time she was not complaining of nausea. She ate decently on the holidays. This past week she has been complaining more of the nausea. She does not eat a full meal. There is a 4-lb weight loss since last visit, she states this is not unusual for her.   She feels her memory is sometimes fine. She continues to live alone, no hygiene concerns. She is independent with dressing and bathing. She manages her own bills but family checks behind her. She talks more about the past and asks repetitive questions at times. She can drive but has not been driving. No personality changes, paranoia or hallucinations. She denies any syncopal episodes since 01/2022. No headaches, dizziness, focal numbness/tingling/weakness, no falls. Sleeping is great. She has continued on Rivastigmine  1.5mg  BID. There was initial concern of insomnia but despite holding Rivastigmine , sleep issues continued then resolved spontaneously with no recurrence despite restarting  Rivastigmine  1.5mg  BID.    History on Initial Assessment 02/16/2022: This is a pleasant 78 year old right-handed woman with a history of hyperlipidemia, CLL in remission, presenting for evaluation of syncope and memory loss. They report that after she had Covid in 03/2021, she has been complaining of generally feeling unwell since. She would say "I can feel it in my head." Previously, she had lost her husband in June 2021 and had episodes of vertigo affecting daily activities, spending her day in bed due to anxiety of having a fall, then she had endolymphatic surgery in 11/2020 and the vertigo resolved completely. She however has had underlying anxiety/worry when she got Covid and had seen Pulmonary thinking she had an infection but did not feel any better. On 11/29/21, she was a passenger in the car and told her daughter she was not feeling well, asking to be taken home and that she needed to go to the bathroom. She then passed out and woke up to EMS around her. Her daughter reported her head went back, eyes rolled back and arms flexed, she was rigid and had shaking, making a very vocal sound ("gurgling moan"), then she was just moaning after. This lasted less than 3 minutes, she was waking up as EMS opened her door. No tongue bite or incontinence. She does nor recall feeling dizzy, no headache, focal numbness/tingling/weakness. She was very sleepy when she got to the hospital, she was nauseated and vomited in the hospital. She was admitted overnight at [facility] where brain MRI without contrast showed chronic microvascular changes moderately severe in the cerebral white  matter, progressed from prior MRI done 04/2020. 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 There was mild generalized cerebral atrophy. Carotid dopplers no significant stenosis, echocardiogram was normal. Zio patch did not show any sustained arrhythmia or prolonged pauses,  occasional PVCs and short SV/V runs were present. She was seen by Cardiology in follow-up. On 02/03/22, she had another syncopal episode. She was with her realtor looking at a house, she recalls coming out and felt like she missed a step. She said she would just sit for a minute, then fel like she was getting lightheaded with the world fading. The realtor reported she was stepping down steps and sat down hard on the ground. Realtor helped her lean against the wall and asked her questions that she was able to answer. Her realtor went in to get a rag and could tell she was going to fall over so she sat next to her as she briefly passed out with a little bit of jerking. She threw up when EMS arrived and faded out again with EMS, saying she wanted to go home. She was pale with note of HR was in the upper 30s/low 40s. In the ER, HR was in the 50s. She was seen by a different cardiologist and a tilt table test has been ordered. No further syncopal episodes since then.  Family has also been concerned about her memory. They started noticing some confusion, repeating herself, since her husband passed away in 09-02-2019. At that time, she was taking Xanax. It was stopped and symptoms got better but did not go away completely. She lives alone. She denies missing medications or bill payments. Jenna Rasmussen notes though that she could not tell family exactly what medication she took or when she took it. She was previously driving and denied getting lost driving. She denies leaving the stove on. Her paternal grandmother and several aunts had dementia. In January 2023, Jenna Rasmussen recalls a strange episode. As previously mentioned, she had been feeling bad since her Covid infection the year prior, they were at a family gathering when she became very distant/removed, not answering when people asked questions but looking at them. She told them she was not feeling well with a headache, got up, then lay on the floor of her bedroom, which is unusual.  She went to sleep on the floor for 30-45 minutes. Family has not noticed any staring/unresponsive episodes. She denies any other gaps in time. She denies any olfactory/gustatory hallucinations, deja vu, rising epigastric sensation, focal numbness/tingling/weakness, myoclonic jerks. Prior to her Covid infection, she did not have a history of headaches. Since then, she has had headaches or complains of her head not feeling right 5 out of 7 days in a week. She takes Tylenol  which does not help, weather changes seem to affect her. She states something "feels heavy and does not feel normal." She describes a pressure in her head, she can still do her daily activities but family has noticed she does not do her chores as often, or making bread/going to grocery, like before. She denies any dizziness, diplopia, dysarthria/dysphagia, neck/back pain, bowel/bladder dysfunction, anosmia, or tremors. Her nephew has seizures. She had a normal birth and early development.  There is no history of febrile convulsions, CNS infections such as meningitis/encephalitis, significant traumatic brain injury, neurosurgical procedures.  Bloodwork at PCP office in 12/2021 showed an LDL of 58, total cholesterol of 137. Bloodwork in 05/2021 showed a normal B12 of 381,  normal TSH.   EEG in 02/2022 normal.  Neuropsychological testing in 07/2022 indicated prominent impairment surrounding both semantic fluency and all aspects of learning and memory, she denied any difficulties functionally and was diagnosed with Mild Neurocognitive Disorder, with concern for underlying etiology of Alzheimer's disease worsened by cerebrovascular factors (mixed presentation).   Brain MRI in 11/2021 showed moderately severe chronic microvascular disease, no acute changes. She was started on Donepezil  10mg  but started having nausea and frequent BMs so she has been taking only 1/2 tablet daily but with continued symptoms. She had been referred to GI due to  this.   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  We discussed repeat injection today and she is interested in pursuing this on a more regular basis to control her symptoms 2.  Steroid injection placed right CMC joint 20 mg Depo-Medrol  1/2 cc 1% Xylocaine  with bicarb after Betadine skin prep well-tolerated 3.  Follow-up in 4 to 2-month intervals f   Pseudophakia of both eyes 02/15/2021   Seizure-like activity (HCC) 11/29/2021   Syncope and collapse 02/11/2022    MEDICATIONS: Current Outpatient Medications on File Prior to Visit  Medication Sig Dispense Refill   albuterol  (PROVENTIL ) (2.5 MG/3ML) 0.083% nebulizer solution Take 2.5 mg by nebulization 2 (two) times daily.     alendronate (FOSAMAX) 70 MG tablet Take 70 mg by mouth once a week.     aspirin 81 MG chewable tablet Chew 81 mg by mouth daily.     atorvastatin (LIPITOR) 40 MG tablet Take 40 mg by mouth daily.     b complex vitamins capsule Take 1 capsule by mouth daily.     budesonide  (PULMICORT ) 0.5 MG/2ML nebulizer solution Take 2 mLs (0.5 mg total) by nebulization 2 (two) times daily. 360 mL 1   calcium-vitamin D (OSCAL WITH D) 500-200 MG-UNIT tablet Take 1 tablet by mouth  daily.     co-enzyme Q-10 30 MG capsule Take by mouth.     dorzolamide-timolol (COSOPT) 2-0.5 % ophthalmic solution Place 1 drop into both eyes 2 (two) times daily.     FLUoxetine (PROZAC) 10 MG capsule Take 10 mg by mouth daily.     fluticasone  (FLONASE ) 50 MCG/ACT nasal spray Place 2 sprays into both nostrils daily. 16 g 2   ibuprofen (ADVIL) 200 MG tablet Take 200 mg by mouth every 8 (eight) hours as needed.     latanoprost (XALATAN) 0.005 % ophthalmic solution Place 1 drop into both eyes at bedtime.     loratadine  (CLARITIN ) 5 MG chewable tablet Chew 5 mg by mouth daily.     meclizine (ANTIVERT) 25 MG tablet Take 25 mg by mouth 3 (three) times daily as needed for dizziness.     montelukast (SINGULAIR) 10 MG tablet Take 10 mg by  mouth daily.     pantoprazole (PROTONIX) 40 MG tablet Take 40 mg by mouth daily.     rivastigmine  (EXELON ) 1.5 MG capsule Take 1 capsule (1.5 mg total) by mouth 2 (two) times daily. 60 capsule 6   No current facility-administered medications on file prior to visit.    ALLERGIES: Allergies  Allergen Reactions   Rifampin     Liver problems and patient ended up in hospital   Codeine     Dizziness stomach upset   Flagyl [Metronidazole Hcl]     rash    FAMILY HISTORY: Family History  Problem Relation Age of Onset   Heart disease Father    Heart attack Father    Hyperlipidemia Father    Dementia Paternal Aunt        several aunts w/dementia and concerns for Alzheimer's disease; onset likely early 70s    SOCIAL HISTORY: Social History   Socioeconomic History   Marital status: Widowed    Spouse name: Not on file   Number of children: Not on file   Years of education: 12   Highest education level: High school graduate  Occupational History   Occupation: Retired    Comment: Investment banker, corporate  Tobacco Use   Smoking status: Former    Current packs/day: 0.00    Types: Cigarettes    Quit date: 04/12/1983    Years since quitting: 40.0   Smokeless tobacco:  Never   Tobacco comments:    smoked x 20 yrs  Vaping Use   Vaping status: Never Used  Substance and Sexual Activity   Alcohol  use: Never   Drug use: Never   Sexual activity: Not on file  Other Topics Concern   Not on file  Social History Narrative   Are you right handed or left handed? Right handed   Are you currently employed ? Retired    What is your current occupation? NA   Do you live at home alone? alone   Who lives with you? NA   What type of home do you live in: 1 story or 2 story? 1 story        Social Drivers of Corporate investment banker Strain: Not on file  Food Insecurity: Not on file  Transportation Needs: Not on file  Physical Activity: Not on file  Stress: Not on file  Social Connections: Not on file  Intimate Partner Violence: Not on file     PHYSICAL EXAM: Vitals:   04/26/23 1122  BP: 116/61  Pulse: 80  SpO2: 96%   General: No acute distress Head:  Normocephalic/atraumatic Skin/Extremities: No rash, no edema Neurological Exam: alert and oriented to person, place, and time. No aphasia or dysarthria. Fund of knowledge is appropriate.  Recent and remote memory are impaired.  Attention and concentration are reduced. MoCA 13/30    04/26/2023   11:00 AM 02/16/2022    1:00 PM  Montreal Cognitive Assessment   Visuospatial/ Executive (0/5) 1 3  Naming (0/3) 1 2  Attention: Read list of digits (0/2) 2 2  Attention: Read list of letters (0/1) 0 1  Attention: Serial 7 subtraction starting at 100 (0/3) 1 3  Language: Repeat phrase (0/2) 1 1  Language : Fluency (0/1) 0 0  Abstraction (0/2) 1 0  Delayed Recall (0/5) 0 1  Orientation (0/6) 6 5  Total 13 18  Adjusted Score (based on education) 13 18   Cranial nerves: Pupils equal, round. Extraocular movements intact with no nystagmus. Visual fields  full.  No facial asymmetry.  Motor: Bulk and tone normal, muscle strength 5/5 throughout with no pronator drift.   Finger to nose testing intact.  Gait  narrow-based and steady, no ataxia. No tremors.   IMPRESSION: This is a pleasant 78 yo RH woman with a history of hyperlipidemia, CLL in remission, with syncope and memory loss. She had 2 episodes of loss of consciousness that occurred 11/2021 and 01/2022 with report of stiffening and shaking with the first episode, bradycardia. MRI brain no acute changes, there was moderate chronic microvascular disease and note of a lacunar infarct that was not seen on imaging from a year ago. She has had an extensive cardiac workup, no arrhythmia seen on holter. Her routine EEG is normal. No further episodes since 01/2022. 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 MoCA today 13/30 (18/30 in 2023). There is some progression in cognitive decline by history and screening, increase Rivastigmine  to 3mg  BID. She is concerned about the nausea that seems to have worsened this week, leading to compliance issues with medications. Check for UTI. She was advised to speak to PCP and continue working on improving eating habits. Anxiety may be playing a role. Discussed eventual need for more help at home, she does not feel she needs it currently, continue to monitor. Follow-up in 4 months, call for any changes.    Thank you for allowing me to participate in her care.  Please do not hesitate to call for any questions or concerns.    Rayfield Cairo, M.D.   CC: Jenna Rasmussen, [place]

## 2023-04-26 NOTE — Patient Instructions (Addendum)
 Good to see you.  Increase Rivastigmine  to 3mg : Take 1 capsule twice a day  2. Have urinalysis done  3. Continue working on eating regularly and speak with PCP about nausea  4.Follow-up in 4 months, call for any changes   FALL PRECAUTIONS: Be cautious when walking. Scan the area for obstacles that may increase the risk of trips and falls. When getting up in the mornings, sit up at the edge of the bed for a few minutes before getting out of bed. Consider elevating the bed at the head end to avoid drop of blood pressure when getting up. Walk always in a well-lit room (use night lights in the walls). Avoid area rugs or power cords from appliances in the middle of the walkways. Use a walker or a cane if necessary and consider physical therapy for balance exercise. Get your eyesight checked regularly.  FINANCIAL OVERSIGHT: Supervision, especially oversight when making financial decisions or transactions is also recommended.  HOME SAFETY: Consider the safety of the kitchen when operating appliances like stoves, microwave oven, and blender. Consider having supervision and share cooking responsibilities until no longer able to participate in those. Accidents with firearms and other hazards in the house should be identified and addressed as well.  DRIVING: Regarding driving, in patients with progressive memory problems, driving will be impaired. We advise to have someone else do the driving if trouble finding directions or if minor accidents are reported. Independent driving assessment is available to determine safety of driving.  ABILITY TO BE LEFT ALONE: If patient is unable to contact 911 operator, consider using LifeLine, or when the need is there, arrange for someone to stay with patients. Smoking is a fire hazard, consider supervision or cessation. Risk of wandering should be assessed by caregiver and if detected at any point, supervision and safe proof recommendations should be  instituted.  MEDICATION SUPERVISION: Inability to self-administer medication needs to be constantly addressed. Implement a mechanism to ensure safe administration of the medications.  RECOMMENDATIONS FOR ALL PATIENTS WITH MEMORY PROBLEMS: 1. Continue to exercise (Recommend 30 minutes of walking everyday, or 3 hours every week) 2. Increase social interactions - continue going to [place] and enjoy social gatherings with friends and family 3. Eat healthy, avoid fried foods and eat more fruits and vegetables 4. Maintain adequate blood pressure, blood sugar, and blood cholesterol level. Reducing the risk of stroke and cardiovascular disease also helps promoting better memory. 5. Avoid stressful situations. Live a simple life and avoid aggravations. Organize your time and prepare for the next day in anticipation. 6. Sleep well, avoid any interruptions of sleep and avoid any distractions in the bedroom that may interfere with adequate sleep quality 7. Avoid sugar, avoid sweets as there is a strong link between excessive sugar intake, diabetes, and cognitive impairment The Mediterranean diet has been shown to help patients reduce the risk of progressive memory disorders and reduces cardiovascular risk. This includes eating fish, eat fruits and green leafy vegetables, nuts like almonds and hazelnuts, walnuts, and also use olive oil. Avoid fast foods and fried foods as much as possible. Avoid sweets and sugar as sugar use has been linked to worsening of memory function.  There is always a concern of gradual progression of memory problems. If this is the case, then we may need to adjust level of care according to patient needs. Support, both to the patient and caregiver, should then be put into place.

## 2023-04-26 NOTE — Telephone Encounter (Signed)
 Pt daughter in law called in wanted Dr Ty Gales to know that pt has not been taken her medication over the past week they do not know if it is due to the Prozac. Pt stated that she will say she has not  eaten or she has nausea. Pt is still not driving ,

## 2023-04-27 LAB — URINALYSIS
Bilirubin Urine: NEGATIVE
Glucose, UA: NEGATIVE
Hgb urine dipstick: NEGATIVE
Leukocytes,Ua: NEGATIVE
Nitrite: NEGATIVE
Specific Gravity, Urine: 1.023 (ref 1.001–1.035)
pH: 5.5 (ref 5.0–8.0)

## 2023-05-03 ENCOUNTER — Telehealth: Payer: Self-pay

## 2023-05-03 NOTE — Telephone Encounter (Signed)
Pt daughter called informed the urinalysis was clean

## 2023-05-03 NOTE — Telephone Encounter (Signed)
-----   Message from Van Clines sent at 05/01/2023 10:11 AM EST ----- Pls let [name] know the urinalysis was clean,thanks

## 2023-05-17 DIAGNOSIS — H401233 Low-tension glaucoma, bilateral, severe stage: Secondary | ICD-10-CM | POA: Diagnosis not present

## 2023-08-02 ENCOUNTER — Ambulatory Visit: Payer: Medicare Other | Admitting: Adult Health

## 2023-08-03 ENCOUNTER — Ambulatory Visit (INDEPENDENT_AMBULATORY_CARE_PROVIDER_SITE_OTHER): Payer: Medicare Other | Admitting: Adult Health

## 2023-08-03 ENCOUNTER — Encounter: Payer: Self-pay | Admitting: Adult Health

## 2023-08-03 VITALS — BP 130/73 | HR 59 | Ht 62.0 in | Wt 90.4 lb

## 2023-08-03 DIAGNOSIS — E46 Unspecified protein-calorie malnutrition: Secondary | ICD-10-CM | POA: Insufficient documentation

## 2023-08-03 DIAGNOSIS — E43 Unspecified severe protein-calorie malnutrition: Secondary | ICD-10-CM

## 2023-08-03 DIAGNOSIS — Z87891 Personal history of nicotine dependence: Secondary | ICD-10-CM | POA: Diagnosis not present

## 2023-08-03 DIAGNOSIS — J479 Bronchiectasis, uncomplicated: Secondary | ICD-10-CM

## 2023-08-03 HISTORY — DX: Unspecified protein-calorie malnutrition: E46

## 2023-08-03 MED ORDER — ARFORMOTEROL TARTRATE 15 MCG/2ML IN NEBU: 15.0000 ug | INHALATION_SOLUTION | Freq: Two times a day (BID) | RESPIRATORY_TRACT | 6 refills | Status: AC

## 2023-08-03 NOTE — Progress Notes (Signed)
 @Patient  ID: Jenna Rasmussen, female    DOB: 1945/12/22, 78 y.o.   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   08/03/2023 Follow up : Bronchiectasis  Patient presents for a 19-month follow-up.  Patient has longstanding chronic bronchiectasis.  History of MAC.  Also colonized with MSSA, Acinetobacter, stenotrophomonas.  Patient has been on Symbicort  long-term but unfortunately insurance formulary has changed and Symbicort  has become unaffordable.  She was changed to budesonide  nebulizer a few months ago.  Says it is working okay but not as well as Symbicort .  We discussed adding an Brovona for the LABA component.  She denies any hemoptysis, chest pain, orthopnea.  Says that she has been staying mildly active at home.  No recent antibiotics.  She denies any hemoptysis or fever. Appetite is fair.  Her weight is currently at 90 pounds with a BMI of 16.  We discussed a high-protein  diet and protein shakes.    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   Bronchiectasis (HCC) 06/17/2021   Cerebrovascular disease    Moderately-severe per 2023 brain MRI   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  We discussed repeat injection today and she is interested in pursuing this on a more regular basis to control her symptoms 2.  Steroid injection placed right CMC joint 20 mg Depo-Medrol  1/2 cc 1% Xylocaine  with bicarb after Betadine skin prep well-tolerated 3.  Follow-up in 4 to 61-month intervals f   Pseudophakia of both eyes 02/15/2021   Seizure-like activity (HCC) 11/29/2021   Syncope and collapse 02/11/2022    Tobacco History: Social History   Tobacco Use  Smoking Status Former   Current packs/day: 0.00  Types: Cigarettes   Quit date: 04/12/1983   Years since quitting: 40.3  Smokeless Tobacco Never  Tobacco Comments   smoked x 20 yrs   Counseling given: Not Answered Tobacco comments: smoked x 20  yrs   Outpatient Medications Prior to Visit  Medication Sig Dispense Refill   aspirin 81 MG chewable tablet Chew 81 mg by mouth daily.     atorvastatin (LIPITOR) 40 MG tablet Take 40 mg by mouth daily.     dorzolamide-timolol (COSOPT) 2-0.5 % ophthalmic solution Place 1 drop into both eyes 2 (two) times daily.     FLUoxetine (PROZAC) 10 MG capsule Take 10 mg by mouth daily.     loratadine  (CLARITIN ) 5 MG chewable tablet Chew 5 mg by mouth daily.     montelukast (SINGULAIR) 10 MG tablet Take 10 mg by mouth daily.     pantoprazole (PROTONIX) 40 MG tablet Take 40 mg by mouth daily.     rivastigmine  (EXELON ) 3 MG capsule Take 1 capsule (3 mg total) by mouth 2 (two) times daily. 180 capsule 3   albuterol  (PROVENTIL ) (2.5 MG/3ML) 0.083% nebulizer solution Take 2.5 mg by nebulization 2 (two) times daily. (Patient not taking: Reported on 08/03/2023)     alendronate (FOSAMAX) 70 MG tablet Take 70 mg by mouth once a week. (Patient not taking: Reported on 08/03/2023)     b complex vitamins capsule Take 1 capsule by mouth daily. (Patient not taking: Reported on 08/03/2023)     budesonide  (PULMICORT ) 0.5 MG/2ML nebulizer solution Take 2 mLs (0.5 mg total) by nebulization 2 (two) times daily. (Patient not taking: Reported on 08/03/2023) 360 mL 1   calcium-vitamin D (OSCAL WITH D) 500-200 MG-UNIT tablet Take 1 tablet by mouth daily. (Patient not taking: Reported on 08/03/2023)     co-enzyme Q-10 30 MG capsule Take by mouth. (Patient not taking: Reported on 08/03/2023)     fluticasone  (FLONASE ) 50 MCG/ACT nasal spray Place 2 sprays into both nostrils daily. (Patient not taking: Reported on 08/03/2023) 16 g 2   ibuprofen (ADVIL) 200 MG tablet Take 200 mg by mouth every 8 (eight) hours as needed. (Patient not taking: Reported on 08/03/2023)     latanoprost (XALATAN) 0.005 % ophthalmic solution Place 1 drop into both eyes at bedtime. (Patient not taking: Reported on 08/03/2023)     meclizine (ANTIVERT) 25 MG tablet Take  25 mg by mouth 3 (three) times daily as needed for dizziness. (Patient not taking: Reported on 08/03/2023)     No facility-administered medications prior to visit.     Review of Systems:   Constitutional:   No  weight loss, night sweats,  Fevers, chills,+ fatigue, or  lassitude.  HEENT:   No headaches,  Difficulty swallowing,  Tooth/dental problems, or  Sore throat,                No sneezing, itching, ear ache, nasal congestion, post nasal drip,   CV:  No chest pain,  Orthopnea, PND, swelling in lower extremities, anasarca, dizziness, palpitations, syncope.   GI  No heartburn, indigestion, abdominal pain, nausea, vomiting, diarrhea, change in bowel habits, loss of appetite, bloody stools.   Resp: .  No chest wall deformity  Skin: no rash or lesions.  GU: no dysuria, change in color of urine, no urgency or frequency.  No flank pain, no hematuria   MS:  No joint pain or swelling.  No decreased range of motion.  No back pain.    Physical Exam  BP  130/73 (BP Location: Left Arm, Patient Position: Sitting, Cuff Size: Normal)   Pulse (!) 59   Ht 5\' 2"  (1.575 m)   Wt 90 lb 6.4 oz (41 kg)   LMP  (LMP Unknown)   SpO2 94%   BMI 16.53 kg/m   GEN: A/Ox3; pleasant , NAD, thin, frail, elderly   HEENT:  [place]/AT,  EACs-clear, TMs-wnl, NOSE-clear, THROAT-clear, no lesions, no postnasal drip or exudate noted.   NECK:  Supple w/ fair ROM; no JVD; normal carotid impulses w/o bruits; no thyromegaly or nodules palpated; no lymphadenopathy.    RESP clear  P & A; w/o, wheezes/ rales/ or rhonchi. no accessory muscle use, no dullness to percussion  CARD:  RRR, no m/r/g, no peripheral edema, pulses intact, no cyanosis or clubbing.  GI:   Soft & nt; nml bowel sounds; no organomegaly or masses detected.   Musco: Warm bil, no deformities or joint swelling noted.   Neuro: alert, no focal deficits noted.    Skin: Warm, no lesions or rashes    Lab Results:    BNP No results found for:  "BNP"  ProBNP No results found for: "PROBNP"  Imaging: No results found.  Administration History     None          Latest Ref Rng & Units 07/23/2021   10:06 AM  PFT Results  FVC-Pre L 2.31   FVC-Predicted Pre % 82   FVC-Post L 2.29   FVC-Predicted Post % 81   Pre FEV1/FVC % % 57   Post FEV1/FCV % % 51   FEV1-Pre L 1.31   FEV1-Predicted Pre % 62   FEV1-Post L 1.17   DLCO uncorrected ml/min/mmHg 9.99   DLCO UNC% % 52   DLCO corrected ml/min/mmHg 9.99   DLCO COR %Predicted % 52   DLVA Predicted % 80   TLC L 5.50   TLC % Predicted % 108   RV % Predicted % 142     No results found for: "NITRICOXIDE"      Assessment & Plan:   Bronchiectasis (HCC) Chronic bronchiectasis with daily cough and mucus. Unfortunately Symbicort  has become unaffordable.  Increased symptom burden on budesonide  slowly.  Will add Brovana  nebulizer twice daily. Mucociliary clearance is discussed in detail.  With flutter valve  Plan  Patient Instructions  Robitussin  1 tsp every 4hr as needed for congestion  Continue on Flutter valve Twice daily  after neb use.  Continue on Budesonide  Neb Twice daily   Add Brovana  Neb Twice daily . (Make sure filed under Medicare B benefits)  Albuterol  neb As needed   Follow up with Dr. Baldwin Levee  in 6 months and As needed   Please contact office for sooner follow up if symptoms do not improve or worsen or seek emergency care     Protein calorie malnutrition (HCC) Encouraged on high-protein diet.  Consider daily protein shakes.     Roena Clark, NP 08/03/2023

## 2023-08-03 NOTE — Assessment & Plan Note (Signed)
 Encouraged on high-protein diet.  Consider daily protein shakes.

## 2023-08-03 NOTE — Assessment & Plan Note (Signed)
 Chronic bronchiectasis with daily cough and mucus. Unfortunately Symbicort  has become unaffordable.  Increased symptom burden on budesonide  slowly.  Will add Brovana  nebulizer twice daily. Mucociliary clearance is discussed in detail.  With flutter valve  Plan  Patient Instructions  Robitussin  1 tsp every 4hr as needed for congestion  Continue on Flutter valve Twice daily  after neb use.  Continue on Budesonide  Neb Twice daily   Add Brovana  Neb Twice daily . (Make sure filed under Medicare B benefits)  Albuterol  neb As needed   Follow up with Dr. Baldwin Levee  in 6 months and As needed   Please contact office for sooner follow up if symptoms do not improve or worsen or seek emergency care

## 2023-08-03 NOTE — Patient Instructions (Addendum)
 Robitussin  1 tsp every 4hr as needed for congestion  Continue on Flutter valve Twice daily  after neb use.  Continue on Budesonide  Neb Twice daily   Add Brovana  Neb Twice daily . (Make sure filed under Medicare B benefits)  Albuterol  neb As needed   Follow up with Dr. Baldwin Levee  in 6 months and As needed   Please contact office for sooner follow up if symptoms do not improve or worsen or seek emergency care

## 2023-08-04 DIAGNOSIS — Z681 Body mass index (BMI) 19 or less, adult: Secondary | ICD-10-CM | POA: Diagnosis not present

## 2023-08-04 DIAGNOSIS — Z1389 Encounter for screening for other disorder: Secondary | ICD-10-CM | POA: Diagnosis not present

## 2023-08-04 DIAGNOSIS — Z0001 Encounter for general adult medical examination with abnormal findings: Secondary | ICD-10-CM | POA: Diagnosis not present

## 2023-08-04 DIAGNOSIS — Z0000 Encounter for general adult medical examination without abnormal findings: Secondary | ICD-10-CM | POA: Diagnosis not present

## 2023-08-09 DIAGNOSIS — D72829 Elevated white blood cell count, unspecified: Secondary | ICD-10-CM | POA: Diagnosis not present

## 2023-08-09 DIAGNOSIS — Z681 Body mass index (BMI) 19 or less, adult: Secondary | ICD-10-CM | POA: Diagnosis not present

## 2023-08-09 DIAGNOSIS — R053 Chronic cough: Secondary | ICD-10-CM | POA: Diagnosis not present

## 2023-08-09 DIAGNOSIS — E782 Mixed hyperlipidemia: Secondary | ICD-10-CM | POA: Diagnosis not present

## 2023-08-09 DIAGNOSIS — R5383 Other fatigue: Secondary | ICD-10-CM | POA: Diagnosis not present

## 2023-08-09 DIAGNOSIS — R5382 Chronic fatigue, unspecified: Secondary | ICD-10-CM | POA: Diagnosis not present

## 2023-08-09 DIAGNOSIS — J3089 Other allergic rhinitis: Secondary | ICD-10-CM | POA: Diagnosis not present

## 2023-08-14 ENCOUNTER — Ambulatory Visit: Payer: BLUE CROSS/BLUE SHIELD | Admitting: Neurology

## 2023-08-14 ENCOUNTER — Encounter: Payer: Self-pay | Admitting: Neurology

## 2023-08-14 VITALS — BP 116/69 | HR 71 | Ht 62.0 in | Wt 92.0 lb

## 2023-08-14 DIAGNOSIS — G3184 Mild cognitive impairment, so stated: Secondary | ICD-10-CM | POA: Diagnosis not present

## 2023-08-14 MED ORDER — RIVASTIGMINE TARTRATE 3 MG PO CAPS: 3.0000 mg | ORAL_CAPSULE | Freq: Two times a day (BID) | ORAL | 3 refills | Status: AC

## 2023-08-14 NOTE — Progress Notes (Signed)
 NEUROLOGY FOLLOW UP OFFICE NOTE  OTILIA KAREEM [id number] [birth year]  HISTORY OF PRESENT ILLNESS: I had the pleasure of seeing Sharai Overbay in follow-up in the neurology clinic on 08/14/2023.  The patient was last seen 4 months ago for syncope and dementia. She is again accompanied by her daughter-in-law [name] who helps supplement the history today.  Records and images were personally reviewed where available. On her last visit, family reported progressive decline, MoCA was 13/30. Rivastigmine  increased to 3mg  BID. She was previously reporting a lot of nausea, she will still complain occasionally but it is not constant like before. Her appetite is still off, she has seen her other physicians and both have talked to her about her diet. She lives alone, family comes daily and fixes her medications. If she sleeps in a little later, a lot of the times her morning pills get skipped and she takes them at 3pm. She continues to manage finances without issues. She does not drive, she sold her car. She sleeps okay. When asked about mood, she states "I don't feel sad." Mood is pleasant but today she feels off, weather affects her head too. [name] feels when family is not there all the time, she feels lonely. She has no initiative to go to the senior center. She mostly watches TV. She denies any headaches, dizziness, focal numbness/tingling/weakness. She is losing muscle, she moves around mostly in her home but does not exercise regularly. No further loss of consciousness since 01/2022.   History on Initial Assessment 02/16/2022: This is a pleasant 78 year old right-handed woman with a history of hyperlipidemia, CLL in remission, presenting for evaluation of syncope and memory loss. They report that after she had Covid in 03/2021, she has been complaining of generally feeling unwell since. She would say "I can feel it in my head." Previously, she had lost her husband in June 2021 and had episodes of vertigo affecting  daily activities, spending her day in bed due to anxiety of having a fall, then she had endolymphatic surgery in 11/2020 and the vertigo resolved completely. She however has had underlying anxiety/worry when she got Covid and had seen Pulmonary thinking she had an infection but did not feel any better. On 11/29/21, she was a passenger in the car and told her daughter she was not feeling well, asking to be taken home and that she needed to go to the bathroom. She then passed out and woke up to EMS around her. Her daughter reported her head went back, eyes rolled back and arms flexed, she was rigid and had shaking, making a very vocal sound ("gurgling moan"), then she was just moaning after. This lasted less than 3 minutes, she was waking up as EMS opened her door. No tongue bite or incontinence. She does nor recall feeling dizzy, no headache, focal numbness/tingling/weakness. She was very sleepy when she got to the hospital, she was nauseated and vomited in the hospital. She was admitted overnight at [facility] where brain MRI without contrast showed chronic microvascular changes moderately severe in the cerebral white matter, progressed from prior MRI done 04/2020. 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 There was mild generalized cerebral atrophy. Carotid dopplers no significant stenosis, echocardiogram was normal. Zio patch did not show any sustained arrhythmia or prolonged pauses, occasional PVCs and short SV/V runs were present. She was seen by Cardiology in follow-up. On 02/03/22, she had another syncopal episode. She  was with her realtor looking at a house, she recalls coming out and felt like she missed a step. She said she would just sit for a minute, then fel like she was getting lightheaded with the world fading. The realtor reported she was stepping down steps and sat down hard on the ground. Realtor helped her lean against the wall and  asked her questions that she was able to answer. Her realtor went in to get a rag and could tell she was going to fall over so she sat next to her as she briefly passed out with a little bit of jerking. She threw up when EMS arrived and faded out again with EMS, saying she wanted to go home. She was pale with note of HR was in the upper 30s/low 40s. In the ER, HR was in the 50s. She was seen by a different cardiologist and a tilt table test has been ordered. No further syncopal episodes since then.  Family has also been concerned about her memory. They started noticing some confusion, repeating herself, since her husband passed away in 09-20-19. At that time, she was taking Xanax. It was stopped and symptoms got better but did not go away completely. She lives alone. She denies missing medications or bill payments. [name] notes though that she could not tell family exactly what medication she took or when she took it. She was previously driving and denied getting lost driving. She denies leaving the stove on. Her paternal grandmother and several aunts had dementia. In January 2023, [name] recalls a strange episode. As previously mentioned, she had been feeling bad since her Covid infection the year prior, they were at a family gathering when she became very distant/removed, not answering when people asked questions but looking at them. She told them she was not feeling well with a headache, got up, then lay on the floor of her bedroom, which is unusual. She went to sleep on the floor for 30-45 minutes. Family has not noticed any staring/unresponsive episodes. She denies any other gaps in time. She denies any olfactory/gustatory hallucinations, deja vu, rising epigastric sensation, focal numbness/tingling/weakness, myoclonic jerks. Prior to her Covid infection, she did not have a history of headaches. Since then, she has had headaches or complains of her head not feeling right 5 out of 7 days in a week. She takes  Tylenol  which does not help, weather changes seem to affect her. She states something "feels heavy and does not feel normal." She describes a pressure in her head, she can still do her daily activities but family has noticed she does not do her chores as often, or making bread/going to grocery, like before. She denies any dizziness, diplopia, dysarthria/dysphagia, neck/back pain, bowel/bladder dysfunction, anosmia, or tremors. Her nephew has seizures. She had a normal birth and early development.  There is no history of febrile convulsions, CNS infections such as meningitis/encephalitis, significant traumatic brain injury, neurosurgical procedures.  Bloodwork at PCP office in 12/2021 showed an LDL of 58, total cholesterol of 137. Bloodwork in 05/2021 showed a normal B12 of 381, normal TSH.   EEG in 02/2022 normal.  Neuropsychological testing in 07/2022 indicated prominent impairment surrounding both semantic fluency and all aspects of learning and memory, she denied any difficulties functionally and was diagnosed with Mild Neurocognitive Disorder, with concern for underlying etiology of Alzheimer's disease worsened by cerebrovascular factors (mixed presentation).   Brain MRI in 11/2021 showed moderately severe chronic microvascular disease, no acute changes. She was started on  Donepezil  10mg  but started having nausea and frequent BMs so she has been taking only 1/2 tablet daily but with continued symptoms. She had been referred to GI due to this.   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  We discussed repeat injection today and she is interested in pursuing this on a more regular basis to control her symptoms 2.  Steroid injection placed right CMC joint 20 mg Depo-Medrol  1/2 cc 1% Xylocaine  with bicarb after Betadine skin prep well-tolerated 3.  Follow-up in 4 to 62-month intervals f   Pseudophakia of both eyes 02/15/2021   Seizure-like activity (HCC) 11/29/2021   Syncope and collapse 02/11/2022    MEDICATIONS: Current Outpatient Medications on File Prior to Visit  Medication Sig Dispense Refill   albuterol  (PROVENTIL ) (2.5 MG/3ML) 0.083% nebulizer solution Take 2.5 mg by nebulization 2 (two) times daily.     arformoterol  (BROVANA ) 15 MCG/2ML NEBU Take 2 mLs (15 mcg total) by nebulization 2 (two) times daily. 120 mL 6   aspirin 81 MG chewable tablet Chew 81 mg by mouth daily.     budesonide  (PULMICORT ) 0.5 MG/2ML nebulizer solution Take 2 mLs (0.5 mg total) by nebulization 2 (two) times daily. 360 mL 1   co-enzyme Q-10 30 MG capsule Take by mouth.     dorzolamide-timolol (COSOPT) 2-0.5 % ophthalmic solution Place 1 drop into both eyes 2 (two) times daily.     FLUoxetine (PROZAC) 10 MG capsule Take 10 mg by mouth daily.     fluticasone  (FLONASE ) 50 MCG/ACT nasal spray Place 2 sprays into both nostrils daily. 16 g 2   ibuprofen (ADVIL) 200 MG tablet Take 200 mg by mouth every 8 (eight) hours as needed.     latanoprost (XALATAN) 0.005 % ophthalmic solution Place 1 drop into both eyes at bedtime.     loratadine  (CLARITIN ) 5 MG chewable tablet Chew 5 mg by mouth daily.     meclizine (ANTIVERT) 25 MG tablet Take 25 mg by mouth 3 (three)  times daily as needed for dizziness.     montelukast (SINGULAIR) 10 MG tablet Take 10 mg by mouth daily.     pantoprazole (PROTONIX) 40 MG tablet Take 40 mg by mouth daily.     rivastigmine  (EXELON ) 3 MG capsule Take 1 capsule (3 mg total) by mouth 2 (two) times daily. 180 capsule 3   No current facility-administered medications on file prior to visit.    ALLERGIES: Allergies  Allergen Reactions   Rifampin     Liver problems and patient ended up in hospital   Codeine     Dizziness stomach upset   Flagyl [Metronidazole Hcl]     rash    FAMILY HISTORY: Family History  Problem Relation Age of Onset   Heart disease Father  Heart attack Father    Hyperlipidemia Father    Dementia Paternal Aunt        several aunts w/dementia and concerns for Alzheimer's disease; onset likely early 28s    SOCIAL HISTORY: Social History   Socioeconomic History   Marital status: Widowed    Spouse name: Not on file   Number of children: Not on file   Years of education: 12   Highest education level: High school graduate  Occupational History   Occupation: Retired    Comment: Investment banker, corporate  Tobacco Use   Smoking status: Former    Current packs/day: 0.00    Types: Cigarettes    Quit date: 04/12/1983    Years since quitting: 40.3   Smokeless tobacco: Never   Tobacco comments:    smoked x 20 yrs  Vaping Use   Vaping status: Never Used  Substance and Sexual Activity   Alcohol  use: Never   Drug use: Never   Sexual activity: Not on file  Other Topics Concern   Not on file  Social History Narrative   Are you right handed or left handed? Right handed   Are you currently employed ? Retired    What is your current occupation? NA   Do you live at home alone? alone   Who lives with you? NA   What type of home do you live in: 1 story or 2 story? 1 story        Social Drivers of Corporate investment banker Strain: Not on file  Food Insecurity: Not on file  Transportation Needs: Not on  file  Physical Activity: Not on file  Stress: Not on file  Social Connections: Not on file  Intimate Partner Violence: Not on file     PHYSICAL EXAM: Vitals:   08/14/23 1054  BP: 116/69  Pulse: 71  SpO2: 96%   General: No acute distress Head:  Normocephalic/atraumatic Skin/Extremities: No rash, no edema Neurological Exam: alert and awake. No aphasia or dysarthria. Fund of knowledge is appropriate.  Attention and concentration are normal.   Cranial nerves: Pupils equal, round. Extraocular movements intact with no nystagmus. Visual fields full.  No facial asymmetry.Hard of hearing.  Motor: Bulk and tone normal, muscle strength 5/5 throughout with no pronator drift.   Finger to nose testing intact.  Gait narrow-based and steady,no ataxia.   IMPRESSION: This is a pleasant 78 yo RH woman with a history of hyperlipidemia, CLL in remission, with syncope and memory loss. She had 2 episodes of loss of consciousness that occurred 11/2021 and 01/2022 with report of stiffening and shaking with the first episode, bradycardia. MRI brain no acute changes, there was moderate chronic microvascular disease and note of a lacunar infarct that was not seen on imaging from a year ago. She has had an extensive cardiac workup, no arrhythmia seen on holter. Her routine EEG is normal. No further episodes since 01/2022. 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 There is gradual decline, she appears to have decreased insight into her condition but likely needs more assistance at home. We discussed repeating Neurocognitive testing to to help guide long-term management. Continue Rivastigmine  3mg  BID. She does not drive. Follow-up in 6 months, call for any changes.   Thank you for allowing me to participate in her care.  Please do not hesitate to call for any questions or concerns.    Rayfield Cairo, M.D.   CC: Levada Raymond,  [place]

## 2023-08-14 NOTE — Patient Instructions (Addendum)
 Good to see you.  Schedule Neurocognitive testing  2. I strongly recommend having a hearing evaluation  3. Continue Rivastigmine  3mg  twice a day  4. Follow-up in 6 months, call for any changes   You have been referred for a neurocognitive evaluation in our office.   The evaluation has two parts.   The first part of the evaluation is a clinical interview with the neuropsychologist (Dr. Kitty Perkins or Dr. Donavon Fudge). Please bring someone with you to this appointment if possible, as it is helpful for the doctor to hear from both you and another adult who knows you well.   The second part of the evaluation is testing with the doctor's technician Bernabe Brew or Burdette Carolin). The testing includes a variety of tasks- mostly question-and-answer, some paper-and-pencil. There is nothing you need to do to prepare for this appointment, but having a good night's sleep prior to the testing, taking medications as you normally would, and bringing eyeglasses and hearing aids (if you wear them), is advised. Please make sure that you wear a mask to the appointment.  Please note: We have to reserve several hours of the neuropsychologist's time and the psychometrician's time for your evaluation appointment. As such, please note that there is a No-Show fee of $100. If you are unable to attend any of your appointments, please contact our office as soon as possible to reschedule.

## 2023-08-17 ENCOUNTER — Institutional Professional Consult (permissible substitution): Admitting: Psychology

## 2023-08-17 ENCOUNTER — Ambulatory Visit

## 2023-09-21 DIAGNOSIS — H401233 Low-tension glaucoma, bilateral, severe stage: Secondary | ICD-10-CM | POA: Diagnosis not present

## 2023-09-29 ENCOUNTER — Encounter: Payer: Self-pay | Admitting: Psychology

## 2023-10-02 DIAGNOSIS — N390 Urinary tract infection, site not specified: Secondary | ICD-10-CM | POA: Diagnosis not present

## 2023-10-02 DIAGNOSIS — R5383 Other fatigue: Secondary | ICD-10-CM | POA: Diagnosis not present

## 2023-10-02 DIAGNOSIS — Z681 Body mass index (BMI) 19 or less, adult: Secondary | ICD-10-CM | POA: Diagnosis not present

## 2023-10-02 DIAGNOSIS — R300 Dysuria: Secondary | ICD-10-CM | POA: Diagnosis not present

## 2023-10-03 ENCOUNTER — Ambulatory Visit (INDEPENDENT_AMBULATORY_CARE_PROVIDER_SITE_OTHER): Admitting: Psychology

## 2023-10-03 ENCOUNTER — Ambulatory Visit: Payer: Self-pay

## 2023-10-03 DIAGNOSIS — G3184 Mild cognitive impairment, so stated: Secondary | ICD-10-CM

## 2023-10-03 NOTE — Progress Notes (Signed)
   Neuropsychology Note [place]. [facility] Department of Neurology     Ms. Jenna Rasmussen called this morning stating that she was unable to attend her scheduled neuropsychological evaluation appointment due to an acute bladder infection. She will be rescheduled at her earliest convenience.

## 2023-10-10 ENCOUNTER — Encounter: Admitting: Psychology

## 2023-10-11 DIAGNOSIS — D72829 Elevated white blood cell count, unspecified: Secondary | ICD-10-CM | POA: Diagnosis not present

## 2023-10-11 DIAGNOSIS — Z0001 Encounter for general adult medical examination with abnormal findings: Secondary | ICD-10-CM | POA: Diagnosis not present

## 2023-10-11 DIAGNOSIS — E039 Hypothyroidism, unspecified: Secondary | ICD-10-CM | POA: Diagnosis not present

## 2023-10-11 DIAGNOSIS — R5383 Other fatigue: Secondary | ICD-10-CM | POA: Diagnosis not present

## 2023-10-11 DIAGNOSIS — R5382 Chronic fatigue, unspecified: Secondary | ICD-10-CM | POA: Diagnosis not present

## 2023-10-23 DIAGNOSIS — R300 Dysuria: Secondary | ICD-10-CM | POA: Diagnosis not present

## 2023-10-23 DIAGNOSIS — Z681 Body mass index (BMI) 19 or less, adult: Secondary | ICD-10-CM | POA: Diagnosis not present

## 2023-10-23 DIAGNOSIS — R634 Abnormal weight loss: Secondary | ICD-10-CM | POA: Diagnosis not present

## 2023-10-23 DIAGNOSIS — R059 Cough, unspecified: Secondary | ICD-10-CM | POA: Diagnosis not present

## 2023-10-23 DIAGNOSIS — D72829 Elevated white blood cell count, unspecified: Secondary | ICD-10-CM | POA: Diagnosis not present

## 2023-10-25 DIAGNOSIS — H401233 Low-tension glaucoma, bilateral, severe stage: Secondary | ICD-10-CM | POA: Diagnosis not present

## 2023-11-08 ENCOUNTER — Ambulatory Visit: Admitting: Psychology

## 2023-11-08 ENCOUNTER — Ambulatory Visit

## 2023-11-08 ENCOUNTER — Encounter: Payer: Self-pay | Admitting: Psychology

## 2023-11-08 DIAGNOSIS — I679 Cerebrovascular disease, unspecified: Secondary | ICD-10-CM

## 2023-11-08 DIAGNOSIS — F0280 Dementia in other diseases classified elsewhere without behavioral disturbance: Secondary | ICD-10-CM

## 2023-11-08 DIAGNOSIS — R4189 Other symptoms and signs involving cognitive functions and awareness: Secondary | ICD-10-CM

## 2023-11-08 DIAGNOSIS — G309 Alzheimer's disease, unspecified: Secondary | ICD-10-CM

## 2023-11-08 HISTORY — DX: Dementia in other diseases classified elsewhere, unspecified severity, without behavioral disturbance, psychotic disturbance, mood disturbance, and anxiety: F02.80

## 2023-11-08 NOTE — Progress Notes (Unsigned)
 NEUROPSYCHOLOGICAL EVALUATION [place]. [facility] [place] Department of Neurology  Date of Evaluation: November 08, 2023  Reason for Referral:   Jenna Rasmussen is a 78 y.o. right-handed Caucasian female referred by Darice Shivers, M.D., to characterize her current cognitive functioning and assist with diagnostic clarity and treatment planning in the context of a previous amnestic mild neurocognitive disorder diagnosis, concerns for underlying Alzheimer's disease, and concern for progressive cognitive and functional decline.   Assessment and Plan:   Clinical Impression(s): Jenna Rasmussen pattern of performance is suggestive of severe impairment surrounding expressive language (phonemic fluency, semantic fluency, confrontation naming) and all aspects of learning and memory. Additional performance weakness/variability was exhibited across processing speed, attention/concentration, executive functioning, and visuospatial abilities. Performances were appropriate relative to age-matched peers across receptive language and a task assessing safety and judgment. Functionally, Jenna Rasmussen's family has taken over medication management, financial management, and bill paying responsibilities. She also no longer drives and there have been concerns surrounding a decline in hygiene and task initiation more broadly. 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  Relative to her previous evaluation in April 2024, fairly noteworthy decline was exhibited across expressive language, phonemic fluency and confrontation naming in particular. Decline was also exhibited across processing speed, executive functioning, and visuospatial abilities. Specific to memory, severe impairment was exhibited across both evaluations.   Regarding the cause for her dementia presentation, concerns for Alzheimer's disease  remain. Across memory testing, Jenna Rasmussen did not benefit from repeated exposure to novel information, was fully amnestic (i.e., 0% retention) across all memory tasks after brief delays, and and performed very poorly across yes/no recognition trials. Taken together, this suggests the presence of rapid forgetting and a prominent storage impairment, both of which are the hallmark characteristics of this illness. Further impairments across semantic fluency and confrontation naming follow typical disease trajectory. This, combined with evidence for progressive decline over time, further strengthens concerns.   There could be a mild vascular contribution given neuroimaging revealing moderate microvascular ischemic disease and a chronic lacunar infarct within the right corona radiata/basal ganglia. While this could help explain variability, especially surrounding processing speed, attention/concentration, and executive functioning, this would not explain amnestic memory patterns and other testing weaknesses. Overall, Alzheimer's disease being the primary driving force of memory dysfunction and functional decline appears most likely.  Recommendations: Jenna Rasmussen has already been prescribed a medication aimed to address memory loss and concerns surrounding Alzheimer's disease (i.e., rivastigmine /Exelon ). She is encouraged to continue taking this medication as prescribed. It is important to highlight that this medication has been shown to slow functional decline in some individuals. There is no current treatment which can stop or reverse cognitive decline when caused by a neurodegenerative illness.   I agree with Jenna Rasmussen and her family's decision to have her fully abstain from all driving pursuits.   It will be important for Jenna Rasmussen to have another person with her when in situations where she may need to process information, weigh the pros and cons of different options, and make decisions, in order to ensure that  she fully understands and recalls all information to be considered. She will likely benefit from the establishment and maintenance of a routine in order to maximize her functional abilities over time.  If not already done, Jenna Rasmussen and her family may want to discuss her wishes regarding durable power of attorney and medical decision  making, so that she can have input into these choices. If they require legal assistance with this, long-term care resource access, or other aspects of estate planning, they could reach out to [employer] at [phone] for a free consultation. Additionally, they may wish to discuss future plans for caretaking and seek out community options for in home/residential care should they become necessary.  Jenna Rasmussen is encouraged to attend to lifestyle factors for brain health (e.g., regular physical exercise, good nutrition habits and consideration of the MIND-DASH diet, regular participation in cognitively-stimulating activities, and general stress management techniques), which are likely to have benefits for both emotional adjustment and cognition. Optimal control of vascular risk factors (including safe cardiovascular exercise and adherence to dietary recommendations) is encouraged. Continued participation in activities which provide mental stimulation and social interaction is also recommended.   Important information should be provided to Jenna Rasmussen in written format in all instances. This information should be placed in a highly frequented and easily visible location within her home to promote recall. External strategies such as written notes in a consistently used memory journal, visual and nonverbal auditory cues such as a calendar on the refrigerator or appointments with alarm, such as on a cell phone, can also help maximize recall.  To address problems with processing speed, she may wish to consider:   -Ensuring that she is alerted when essential material or  instructions are being presented   -Adjusting the speed at which new information is presented   -Allowing for more time in comprehending, processing, and responding in conversation   -Repeating and paraphrasing instructions or conversations aloud  To address problems with fluctuating attention and/or executive dysfunction, she may wish to consider:   -Avoiding external distractions when needing to concentrate   -Limiting exposure to fast paced environments with multiple sensory demands   -Writing down complicated information and using checklists   -Attempting and completing one task at a time (i.e., no multi-tasking)   -Verbalizing aloud each step of a task to maintain focus   -Taking frequent breaks during the completion of steps/tasks to avoid fatigue   -Reducing the amount of information considered at one time   -Scheduling more difficult activities for a time of day where she is usually most alert  Review of Records:   Ms. Sobczak was seen by [facility] ETTERDarice Shivers, M.D.) on 02/16/2022 for an evaluation of syncope and memory loss. Briefly, on 11/29/2021, Ms. Mofield was a passenger in the car and told her daughter she was not feeling well, asking to be taken home and that she needed to go to the bathroom. She then passed out and woke up to EMS around her. Her daughter reported her head went back, her eyes rolled back, her arms flexed, and she was rigid and had shaking, making a very vocal sound (gurgling moan). This lasted less than three minutes. No tongue bite or incontinence was noted. She did not recall feeling dizzy, having headache symptoms, or experiencing focal numbness/tingling/weakness. She was very sleepy when she got to the hospital, was nauseated, and vomited in the hospital. A brain MRI at that time revealed moderately severe microvascular ischemic changes, said to have progressed relative to a 2022 MRI. There was also a chronic lacunar infarct within the right corona  radiata/basal ganglia, new relative to her prior MRI, as well as mild generalized cerebral atrophy. Carotid dopplers showed no significant stenosis and her echocardiogram was normal. Zio patch did not show any sustained arrhythmia or prolonged  pauses; occasional PVCs and short SV/V runs were present. On 02/03/2022 she had another syncopal episode. She was with her realtor looking at a house, recalling coming out of the house and felt like she missed a step. She then felt like she was getting lightheaded with the world fading. The realtor reported that Ms. Vanoverbeke was stepping down steps and sat down hard on the ground. Her realtor went in to get a rag and could tell she was going to fall over so she sat next to her as Ms. Stierwalt briefly passed out with a little bit of jerking. She threw up when EMS arrived and faded out again with EMS, saying she wanted to go home. She was pale with note of HR was in the upper 30s/low 40s. In the ER, HR was in the 50s.   Family has also been concerned about her memory. They described ongoing confusion and increased repetitive in conversation, particularly since her husband passed away in 12/06/2019. She was noted to be taking Xanax at that time. Upon stopping, symptoms did improve but some persisted. Functionally, Ms. Bayon lives alone and denied day-to-day functional concerns. Family noted concerns that she is not aware of what she is taking or when she takes it. Performance on a brief cognitive screening instrument (MOCA) was 18/30. Ultimately, Ms. Dray was referred for a comprehensive neuropsychological evaluation to characterize her cognitive abilities and to assist with diagnostic clarity and treatment planning.    Bloodwork at PCP office in 12/2021 showed an LDL of 58, total cholesterol of 137. Bloodwork in 05/2021 showed a normal B12 of 381, normal TSH.    She was seen by Dr. Georjean on 05/26/2022 for follow-up. No further syncopal versus seizure-like episodes were reported in the  interim. Cognition was described as stable.   She completed a comprehensive neuropsychological evaluation with myself on 07/18/2022. Results suggested prominent impairment surrounding both semantic fluency and all aspects of learning and memory. Further performance variability was exhibited across processing speed, attention/concentration, executive functioning, and confrontation naming. Prominent functional impairment was not reported at that time and Ms. Huser was diagnosed with a mild neurocognitive disorder. Concerns for underlying Alzheimer's disease were expressed. Repeat testing was recommended.   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  We discussed repeat injection today and she is interested in pursuing this on a more regular basis to control her symptoms 2.  Steroid injection placed right CMC joint 20 mg Depo-Medrol  1/2 cc 1% Xylocaine  with bicarb after Betadine skin prep well-tolerated 3.  Follow-up in 4 to 47-month intervals f   Protein calorie malnutrition 08/03/2023   Pseudophakia of both eyes 02/15/2021   Seizure-like activity (HCC) 11/29/2021   Syncope and collapse 02/11/2022    Past Surgical History:  Procedure Laterality Date   BACK SURGERY  ENDOLYMPHATIC SAC  DECOMPRESSION Right 11/16/2020   Procedure: RIGHT ENDOLYMPHATIC SAC DECOMPRESSION;  Surgeon: Karis Clunes, MD;  Location: [facility];  Service: ENT;  Laterality: Right;    Current Outpatient Medications:    albuterol  (PROVENTIL ) (2.5 MG/3ML) 0.083% nebulizer solution, Take 2.5 mg by nebulization 2 (two) times daily., Disp: , Rfl:    arformoterol  (BROVANA ) 15 MCG/2ML NEBU, Take 2 mLs (15 mcg total) by nebulization 2 (two) times daily., Disp: 120 mL, Rfl: 6   aspirin 81 MG chewable tablet, Chew 81 mg by mouth daily., Disp: , Rfl:    budesonide  (PULMICORT ) 0.5 MG/2ML nebulizer solution, Take 2 mLs (0.5 mg total) by nebulization 2 (two) times daily., Disp: 360 mL, Rfl: 1   co-enzyme Q-10 30 MG capsule, Take by mouth., Disp: , Rfl:    dorzolamide-timolol (COSOPT) 2-0.5 % ophthalmic solution, Place 1 drop into both eyes 2 (two) times daily., Disp: , Rfl:    FLUoxetine (PROZAC) 10 MG capsule, Take 10 mg by mouth daily., Disp: , Rfl:    fluticasone  (FLONASE ) 50 MCG/ACT nasal spray, Place 2 sprays into both nostrils daily., Disp: 16 g, Rfl: 2   ibuprofen (ADVIL) 200 MG tablet, Take 200 mg by mouth every 8 (eight) hours as needed., Disp: , Rfl:    latanoprost (XALATAN) 0.005 % ophthalmic solution, Place 1 drop into both eyes at bedtime., Disp: , Rfl:    loratadine  (CLARITIN ) 5 MG chewable tablet, Chew 5 mg by mouth daily., Disp: , Rfl:    meclizine (ANTIVERT) 25 MG tablet, Take 25 mg by mouth 3 (three) times daily as needed for dizziness., Disp: , Rfl:    montelukast (SINGULAIR) 10 MG tablet, Take 10 mg by mouth daily., Disp: , Rfl:    pantoprazole (PROTONIX) 40 MG tablet, Take 40 mg by mouth daily., Disp: , Rfl:    rivastigmine  (EXELON ) 3 MG capsule, Take 1 capsule (3 mg total) by mouth 2 (two) times daily., Disp: 180 capsule, Rfl: 3  Clinical Interview:   The following information was obtained during a clinical interview with Ms. Staff, her daughter, and her daughter-in-law prior to cognitive  testing.  Cognitive Symptoms: Decreased short-term memory: Largely denied. Her daughter-in-law previously noted that she and other family members have observed Ms. Lucia as being quite repetitive in conversation, sometimes exhibiting concerns surrounding rapid forgetting. She also noted that Ms. Boehm had been calling physician offices with questions and then not remember the reason for her initial call when the office calls her back (or that she had called at all). Currently, her family expressed similar concerns, reporting their perception of progressive cognitive decline relative to her previous April 2024 evaluation. Overall, records suggest potential memory decline dating back to 2021.  Decreased long-term memory: Denied. Decreased attention/concentration: Denied. Her daughter-in-law previously wondered about an increased ease in distractibility. She noted that it will take Ms. Oatley an extremely long time shopping at the grocery store. She theorized that Ms. Deupree may get distracted by various foods, only to then forget what she was initially searching for. This results in her circling the store for extended periods of time. Currently, her family expressed concern for worsening attention and increased distractibility.  Reduced processing speed: Denied. Difficulties with executive functions: Denied. Her family reported that Ms. Hassebrock would have significant difficulty attempting to multi-task in any capacity. No trouble with impulsivity was noted. No prominent personality changes were described.  Difficulties with emotion regulation: Denied. Difficulties with receptive language: Denied. Difficulties with word finding: Denied.  Decreased visuoperceptual ability: Denied.  Difficulties completing ADLs: Endorsed. Ms. Brindle's family have essentially taken over all medication management, financial management, and bill paying responsibilities. This was reportedly out of necessity given missed mediations  and missed bill payments. This represents a fairly prominent change relative to her 2024 evaluation. Ms. Christo no longer drives.   Additional Medical History: History of traumatic brain injury/concussion: Denied. History of stroke: Endorsed (see above).  History of seizure activity: Unclear (see above). No syncopal versus seizure-like episodes were said to have occurred since October 2023.  History of known exposure to toxins: Denied. Symptoms of chronic pain: Denied. Experience of frequent headaches/migraines: Endorsed. She previously reported generally mild headache symptoms occurring several days per week. Sometimes she will take Tylenol  to help; other times, medication is not necessary for pain management. Currently, she reported a decrease in headache frequency.  Frequent instances of dizziness/vertigo: Denied. However, prominent vertigo spells have been present in the past. This has been much improved since her right endolymphatic sac decompression procedure in 2022.   Sensory changes: She has severe symptoms of glaucoma but is able to see functionally without difficulty with medication intervention. She also reported hearing loss in her right ear. No hearing aid intervention was reported. Other sensory changes/difficulties (e.g., taste or smell) were reported.  Balance/coordination difficulties: Denied. She denied any recent falls. However, since her syncopal episodes, she did report being far more cautious while walking. One side of the body was not said to be weaker than the other.  Other motor difficulties: Denied.  Other medical conditions: Her daughter-in-law previously reported poor nutritional intake, expressing concern that Ms. Greenlaw will pick at food and has lost weight as a result. She also noted that her sodium and potassium levels were abnormal around the time of prior syncopal episodes. This concern has persisted to present day, with her family noting that Ms. Desantiago has lost  between 20-25 pounds due to appetite suppression.    Sleep History: Estimated hours obtained each night: 8+ hours.  Difficulties falling asleep: Denied. Difficulties staying asleep: Denied. Feels rested and refreshed upon awakening: Endorsed. Previously, her daughter-in-law did note that, behaviorally, Ms. Rocco seems to lounge about and can be seen in her robe later in the morning or early afternoon. While she was not concerned with this outright, her daughter-in-law noted that this was a personality change. Currently, her family reported Ms. Aispuro commonly napping during the day, as well as going to sleep early and resting throughout the night.    History of snoring: Unclear.  History of waking up gasping for air: Denied. Witnessed breath cessation while asleep: Denied.   History of vivid dreaming: Denied. Excessive movement while asleep: Denied. Instances of acting out her dreams: Denied.  Psychiatric/Behavioral Health History: Depression: She described her current mood as fine and denied to her knowledge any prior mental health concerns or formal diagnoses. Her daughter-in-law previously noted that she was prescribed some mood-related medications around the time of Ms. Whetstone's husband's passing in 2021. No current medication intervention was reported. Her family noted that had been prescribed Prozac in the past but stopped this following her prior UTI in June. Current or remote suicidal ideation, intent, or plan was denied.  Anxiety: Denied. Mania: Denied. Trauma History: Denied. Visual/auditory hallucinations: Denied. Delusional thoughts: Denied.   Tobacco: Denied. Alcohol : She denied current alcohol  consumption as well as a history of problematic alcohol  abuse or dependence.  Recreational drugs: Denied.  Family History: Problem Relation Age of Onset  Heart disease Father    Heart attack Father    Hyperlipidemia Father    Dementia Paternal Aunt        several aunts  w/dementia and concerns for Alzheimer's disease; onset likely early 67s   This information was confirmed by Ms. Rumore.  Academic/Vocational History: Highest level of educational attainment: 12 years. She graduated from high school and described herself as an average (B/C) student in academic settings. No relative weaknesses were reported.  History of developmental delay: Denied. History of grade repetition: Denied. Enrollment in special education courses: Denied. History of LD/ADHD: Denied.   Employment: Retired. 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   Evaluation Results:   Behavioral Observations: Ms. Cadle was accompanied by her daughter and daughter-in-law, arrived to her appointment on time, and was appropriately dressed and groomed. She appeared alert. Observed gait and station were within normal limits. Gross motor functioning appeared intact upon informal observation and no abnormal movements (e.g., tremors) were noted. Her affect was generally relaxed and positive. Spontaneous speech was fluent. Mild word finding difficulties were observed during interview. Thought processes were coherent, organized, and normal in content. Insight into her cognitive difficulties appeared poor and I do not believe that Ms. Pilkington has appropriate insight into the degree of cognitive impairment, especially surrounding memory capabilities.   During testing, mild hearing loss was apparent, requiring Ms. Manz to request that task instructions be repeated on numerous occasions. Sustained attention was appropriate. Word finding difficulties were more apparent across testing relative to interview. Task engagement was adequate and she persisted when challenged. Overall, Ms. Delrosario was cooperative with the clinical interview and subsequent testing procedures.   Adequacy of Effort: The validity of neuropsychological testing is limited by the extent  to which the individual being tested may be assumed to have exerted adequate effort during testing. Ms. Gomes expressed her intention to perform to the best of her abilities and exhibited adequate task engagement and persistence. Scores across stand-alone and embedded performance validity measures were variable but largely within expectation. Her sole below expectation performance is believed to be due to true and significant memory dysfunction rather than poor engagement or attempts to perform poorly. As such, the results of the current evaluation are believed to be a valid representation of Ms. Fluty's current cognitive functioning.  Test Results: Ms. Printy was mildly disoriented at the time of the current evaluation. She incorrectly stated her age age (83) and the current year (2026). She was also unable to state the current date, time or, name of the current clinic.  Intellectual abilities based upon educational and vocational attainment were estimated to be in the average range. Premorbid abilities were estimated to be within the average range based upon a single-word reading test.   Processing speed was variable, ranging from the exceptionally low to average normative ranges. Basic attention was variable, ranging from the well below average to average normative ranges. More complex attention (e.g., working memory) was also variable, ranging from the well below average to average normative ranges. Executive functioning was also variable, ranging from the exceptionally low to average normative ranges. She performed in the above average range across a task assessing safety and judgment.   Assessed receptive language abilities were average. Likewise, Ms. Dao did not exhibit any difficulties comprehending task instructions and answered all questions asked of her appropriately. Assessed expressive language (e.g., verbal fluency and confrontation naming) was exceptionally low to well below average.  Assessed visuospatial/visuoconstructional abilities were exceptionally low to below average. Across her drawing of a clock, she exhibited mild numerical spacing errors and was unable to place the clock hands in a recognizable manner.    Learning (i.e., encoding) of novel verbal information was exceptionally low. Spontaneous delayed recall (i.e., retrieval) of previously learned information was exceptionally low. Retention rates were 0% across a list learning task, 0% across a story learning task, and 0% across a figure drawing task. Performance across recognition tasks was exceptionally low to well below average, suggesting negligible evidence for information consolidation.   Results of emotional screening instruments suggested that recent symptoms of generalized anxiety were in the minimal range, while symptoms of depression were within normal limits. A screening instrument assessing recent sleep quality suggested the presence of minimal sleep dysfunction.  Table of Scores:   Note: This summary of test scores accompanies the interpretive report and should not be considered in isolation without reference to the appropriate sections in the text. Descriptors are based on appropriate normative data and may be adjusted based on clinical judgment. Terms such as Within Normal Limits and Outside Normal Limits are used when a more specific description of the test score cannot be determined. Descriptors refer to the current evaluation only.        Percentile - Normative Descriptor > 98 - Exceptionally High 91-97 - Well Above Average 75-90 - Above Average 25-74 - Average 9-24 - Below Average 2-8 - Well Below Average < 2 - Exceptionally Low        Validity: April 2024 Current  DESCRIPTOR        DCT: --- --- --- Within Normal Limits  RBANS EI: --- --- --- Outside Normal Limits  WAIS-IV RDS: --- --- --- Within Normal Limits        Orientation:       Raw Score Raw Score Percentile   NAB  Orientation, Form 1 29/29 22/29 --- ---        Cognitive Screening:       Raw Score Raw Score Percentile   SLUMS: 20/30 14/30 --- ---        RBANS, Form A: Standard Score/ Scaled Score Standard Score/ Scaled Score Percentile   Total Score 62 51 <1 Exceptionally Low  Immediate Memory 61 57 <1 Exceptionally Low    List Learning 4 3 1  Exceptionally Low    Story Memory 3 3 1  Exceptionally Low  Visuospatial/Constructional 87 72 3 Well Below Average    Figure Copy 9 7 16  Below Average    Line Orientation 14/20 9/20 <2 Exceptionally Low  Language 85 68 2 Well Below Average    Picture Naming 10/10 8/10 3-9 Well Below Average    Semantic Fluency 4 4 2  Well Below Average  Attention 72 56 <1 Exceptionally Low    Digit Span 5 5 5  Well Below Average    Coding 6 2 <1 Exceptionally Low  Delayed Memory 44 40 <1 Exceptionally Low    List Recall 0/10 0/10 <2 Exceptionally Low    List Recognition 13/20 10/20 <2 Exceptionally Low    Story Recall 1 1 <1 Exceptionally Low    Story Recognition 7/12 7/12 5-7 Well Below Average    Figure Recall 2 1 <1 Exceptionally Low    Figure Recognition 1/8 2/8 2-3 Well Below Average        Intellectual Functioning:       Standard Score Standard Score Percentile   Test of Premorbid Functioning: 92 92 30  Average        Attention/Executive Function:      Trail Making Test (TMT): Raw Score (T Score) Raw Score (T Score) Percentile     Part A 71 secs.,  0 errors (30) 141 secs.,  0 errors (21) <1 Exceptionally Low    Part B Discontinued Discontinued --- Impaired          Scaled Score Scaled Score Percentile   WAIS-IV Digit Span: 5 6 9  Below Average    Forward 8 8 25  Average    Backward 8 5 5  Well Below Average    Sequencing 2 8 25  Average         Scaled Score Scaled Score Percentile   WAIS-IV Similarities: 7 6 9  Below Average        D-KEFS Color-Word Interference Test: Raw Score (Scaled Score) Raw Score (Scaled Score) Percentile     Color Naming 36 secs.  (9) 36 secs. (9) 37 Average    Word Reading 26 secs. (10) 33 secs. (6) 9 Below Average    Inhibition 57 secs. (13) 87 secs. (8) 25 Average      Total Errors 1 error (12) 0 errors (13) 84 Above Average    Inhibition/Switching 68 secs. (12) 181 secs. (1) <1 Exceptionally Low      Total Errors 21 errors (1) 10 errors (3) 1 Exceptionally Low        D-KEFS Verbal Fluency Test: Raw Score (Scaled Score) Raw Score (Scaled Score) Percentile     Letter Total Correct 30 (9) 10 (3) 1 Exceptionally Low    Category Total Correct 21 (5) 17 (4) 2 Well Below Average    Category Switching Total Correct 6 (3) 3 (1) <1 Exceptionally Low    Category Switching Accuracy 3 (2) 1 (1) <1 Exceptionally Low      Total Set Loss Errors 4 (8) 3 (9) 37 Average      Total Repetition Errors 1 (12) 0 (13) 84 Above Average        NAB Executive Functions Module, Form 1: T Score T Score Percentile     Judgment 66 60 84 Above Average        Language:      Verbal Fluency Test: Raw Score (T Score) Raw Score (T Score) Percentile     Phonemic Fluency (FAS) 30 (44) 10 (22) <1 Exceptionally Low    Animal Fluency 11 (34) 8 (25) 1 Exceptionally Low         NAB Language Module, Form 1: T Score T Score Percentile     Auditory Comprehension 45 45 31 Average    Naming 25/31 (35) 21/31 (22) <1 Exceptionally Low        Visuospatial/Visuoconstruction:       Raw Score Raw Score Percentile   Clock Drawing: 7/10 6/10 --- Impaired         Scaled Score Scaled Score Percentile   WAIS-IV Block Design: 8 6 9  Below Average        Mood and Personality:       Raw Score Raw Score Percentile   Geriatric Depression Scale: 0 4 --- Within Normal Limits  Geriatric Anxiety Scale: 0 0 --- Minimal    Somatic 0 0 --- Minimal    Cognitive 0 0 --- Minimal    Affective 0 0 --- Minimal        Additional Questionnaires:       Raw Score Raw Score Percentile   PROMIS Sleep Disturbance Questionnaire: 8  10 --- None to Slight   Informed Consent and  Coding/Compliance:   The current evaluation represents a clinical evaluation for the purposes previously outlined by the referral source and is in no way reflective of a forensic evaluation.   Ms. Kamen was provided with a verbal description of the nature and purpose of the present neuropsychological evaluation. Also reviewed were the foreseeable risks and/or discomforts and benefits of the procedure, limits of confidentiality, and mandatory reporting requirements of this provider. The patient was given the opportunity to ask questions and receive answers about the evaluation. Oral consent to participate was provided by the patient.   This evaluation was conducted by Arthea KYM Maryland, Ph.D., ABPP-CN, board certified clinical neuropsychologist. Ms. Kucharski completed a clinical interview with Dr. Maryland, billed as one unit [phone], and 120 minutes of cognitive testing and scoring, billed as one unit [phone] and three additional units 96139. Psychometrist Luke Pitcher, B.S. assisted Dr. Maryland with test administration and scoring procedures. As a separate and discrete service, one unit [phone] and two units 96133 (160 minutes) were billed for Dr. Loralee time spent in interpretation and report writing.

## 2023-11-08 NOTE — Progress Notes (Signed)
   Psychometrician Note   Cognitive testing was administered to Jenna Rasmussen by Luke Pitcher, B.S. (psychometrist) under the supervision of Dr. Zachary C. Merz, Ph.D., ABPP, licensed psychologist on 11/08/2023. Jenna Rasmussen did not appear overtly distressed by the testing session per behavioral observation or responses across self-report questionnaires. Rest breaks were offered.    The battery of tests administered was selected by Dr. Zachary C. Merz, Ph.D., ABPP with consideration to Jenna Rasmussen's current level of functioning, the nature of her symptoms, emotional and behavioral responses during interview, level of literacy, observed level of motivation/effort, and the nature of the referral question. This battery was communicated to the psychometrist. Communication between Dr. Arthea KYM Maryland, Ph.D., ABPP and the psychometrist was ongoing throughout the evaluation and Dr. Arthea KYM Maryland, Ph.D., ABPP was immediately accessible at all times. Dr. Zachary C. Merz, Ph.D., ABPP provided supervision to the psychometrist on the date of this service to the extent necessary to assure the quality of all services provided.    Jenna Rasmussen will return within approximately 1-2 weeks for an interactive feedback session with Dr. Maryland at which time her test performances, clinical impressions, and treatment recommendations will be reviewed in detail. Jenna Rasmussen understands she can contact our office should she require our assistance before this time.  A total of 120 minutes of billable time were spent face-to-face with [name] by the psychometrist. This includes both test administration and scoring time. Billing for these services is reflected in the clinical report generated by Dr. Arthea KYM Maryland, Ph.D., ABPP  This note reflects time spent with the psychometrician and does not include test scores or any clinical interpretations made by Dr. Maryland. The full report will follow in a separate note.

## 2023-11-09 ENCOUNTER — Encounter: Payer: Self-pay | Admitting: Psychology

## 2023-11-20 ENCOUNTER — Ambulatory Visit: Admitting: Psychology

## 2023-11-20 DIAGNOSIS — G309 Alzheimer's disease, unspecified: Secondary | ICD-10-CM | POA: Diagnosis not present

## 2023-11-20 DIAGNOSIS — F0280 Dementia in other diseases classified elsewhere without behavioral disturbance: Secondary | ICD-10-CM

## 2023-11-20 NOTE — Progress Notes (Signed)
   Neuropsychology Feedback Session Jolynn DEL. [facility] [place] Department of Neurology  Reason for Referral:   Jenna Rasmussen is a 78 y.o. right-handed Caucasian female referred by Darice Shivers, M.D., to characterize her current cognitive functioning and assist with diagnostic clarity and treatment planning in the context of a previous amnestic mild neurocognitive disorder diagnosis, concerns for underlying Alzheimer's disease, and concern for progressive cognitive and functional decline.   Feedback:   [name] completed a comprehensive neuropsychological evaluation on 11/08/2023. Please refer to that encounter for the full report and recommendations. Briefly, results suggested severe impairment surrounding expressive language (phonemic fluency, semantic fluency, confrontation naming) and all aspects of learning and memory. Additional performance weakness/variability was exhibited across processing speed, attention/concentration, executive functioning, and visuospatial abilities. Relative to her previous evaluation in April 2024, fairly noteworthy decline was exhibited across expressive language, phonemic fluency and confrontation naming in particular. Decline was also exhibited across processing speed, executive functioning, and visuospatial abilities. Specific to memory, severe impairment was exhibited across both evaluations. Regarding the cause for her dementia presentation, concerns for Alzheimer's disease remain. Across memory testing, [name] did not benefit from repeated exposure to novel information, was fully amnestic (i.e., 0% retention) across all memory tasks after brief delays, and and performed very poorly across yes/no recognition trials. Taken together, this suggests the presence of rapid forgetting and a prominent storage impairment, both of which are the hallmark characteristics of this illness. Further impairments across semantic fluency and confrontation naming follow  typical disease trajectory. This, combined with evidence for progressive decline over time, further strengthens concerns.   [name] was accompanied by her daughter and daughter-in-law during the current feedback session. Content of the current session focused on the results of her neuropsychological evaluation. [name] was given the opportunity to ask questions and her questions were answered. She was encouraged to reach out should additional questions arise. A copy of her report was provided at the conclusion of the visit.      One unit 96132 (33 minutes) was billed for Dr. Loralee time spent preparing for, conducting, and documenting the current feedback session with [name].

## 2023-11-27 DIAGNOSIS — R053 Chronic cough: Secondary | ICD-10-CM | POA: Diagnosis not present

## 2023-11-27 DIAGNOSIS — Z681 Body mass index (BMI) 19 or less, adult: Secondary | ICD-10-CM | POA: Diagnosis not present

## 2023-11-27 DIAGNOSIS — G3184 Mild cognitive impairment, so stated: Secondary | ICD-10-CM | POA: Diagnosis not present

## 2023-11-27 DIAGNOSIS — R5383 Other fatigue: Secondary | ICD-10-CM | POA: Diagnosis not present

## 2023-11-28 ENCOUNTER — Institutional Professional Consult (permissible substitution): Admitting: Psychology

## 2023-11-28 ENCOUNTER — Ambulatory Visit: Payer: Self-pay

## 2023-12-07 ENCOUNTER — Encounter: Admitting: Psychology

## 2023-12-20 ENCOUNTER — Telehealth: Payer: Self-pay | Admitting: Neurology

## 2023-12-20 NOTE — Telephone Encounter (Signed)
**Note De-identified  [place] Obfuscation** Please advise 

## 2023-12-20 NOTE — Telephone Encounter (Signed)
 Pts daughter in law is calling in stating that she would like to know if the pt needs to be seen sooner than the scheduled appt. Purpose of the question is because of the evaluation with Dr. Richie in August and he stated that pts memory is getting a little worse and wanted to see if Dr. Georjean wanted to start pt on a medication for it.

## 2024-02-01 NOTE — Telephone Encounter (Signed)
 Offered opening on 10/24

## 2024-02-05 ENCOUNTER — Encounter: Payer: Self-pay | Admitting: Adult Health

## 2024-02-05 ENCOUNTER — Ambulatory Visit (INDEPENDENT_AMBULATORY_CARE_PROVIDER_SITE_OTHER): Admitting: Adult Health

## 2024-02-05 ENCOUNTER — Telehealth: Payer: Self-pay

## 2024-02-05 ENCOUNTER — Ambulatory Visit (INDEPENDENT_AMBULATORY_CARE_PROVIDER_SITE_OTHER)

## 2024-02-05 ENCOUNTER — Telehealth: Payer: Self-pay | Admitting: Adult Health

## 2024-02-05 VITALS — BP 94/66 | HR 78 | Temp 97.5°F | Ht 62.0 in | Wt 90.0 lb

## 2024-02-05 DIAGNOSIS — R4189 Other symptoms and signs involving cognitive functions and awareness: Secondary | ICD-10-CM

## 2024-02-05 DIAGNOSIS — E43 Unspecified severe protein-calorie malnutrition: Secondary | ICD-10-CM

## 2024-02-05 DIAGNOSIS — R5381 Other malaise: Secondary | ICD-10-CM | POA: Diagnosis not present

## 2024-02-05 DIAGNOSIS — J479 Bronchiectasis, uncomplicated: Secondary | ICD-10-CM

## 2024-02-05 DIAGNOSIS — J471 Bronchiectasis with (acute) exacerbation: Secondary | ICD-10-CM

## 2024-02-05 MED ORDER — BRENSOCATIB 25 MG PO TABS: 25.0000 mg | ORAL_TABLET | Freq: Every day | ORAL | 5 refills | Status: DC

## 2024-02-05 NOTE — Telephone Encounter (Signed)
 Called and spoke to pt's daughter in law, [name] ([place]) Pt was prescribed Brinsupri at [facility] on 02/05/24 with Tammy Parrett. I informed pt that there is some paperwork to get filled out, and that I will be sending them the online link via MyChart message. [name] verbalized understanding, NFN. We will submit our portion of the paperwork to the pharmacy team.

## 2024-02-05 NOTE — Progress Notes (Signed)
 @Patient  ID: Jenna Rasmussen, female    DOB: 1946-02-11, 78 y.o.   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 Discussed the use of AI scribe software for clinical note transcription with the patient, who gave verbal consent to proceed.  History of Present Illness Jenna Rasmussen is a 78 year old female with bronchiectasis who presents with ongoing cough and mucus production.  She has a persistent dry, unproductive cough, recently accompanied by an episode of coughing up green mucus with a rattling sound. Her cough often leads to difficulty recovering her breath. Oxygen  saturation levels have varied, with readings in the mid to upper eighties while at the beach, improving to 93-96% at home.  Walk test today in the office shows no significant desaturations on room air.  She has been experiencing nausea, a 'heavy head', and a  general sense of feeling unwell, contributing to inactivity over the past six months. Her appetite is poor, and she has been losing weight.    Her current medications include budesonide  and Brovana  for nebulizer treatments, . She uses a nebulizer typically once a day and not twice a day as prescribed.  Also family is concerned that sometimes she does not do it and she definitely does not do her flutter valve.  She does have memory loss/cognitive impairment and was recently told this has worsened.  She is followed by neurology.    She has been off several medications due to low weight and nausea, including fluoxetine and Exelon , previously used for memory issues.  Her family reports significant inactivity, spending most of her time in bed or moving from bed to chair. She has a poor appetite, preferring caffeine-free Pepsi and bacon biscuits, and struggles with memory issues, which have been evaluated by a neurologist. She lives alone, with family members visiting daily to assist with meals and ensure she takes her medications.     Allergies  Allergen Reactions   Rifampin     Liver problems and patient ended up in hospital   Codeine     Dizziness stomach upset   Flagyl [Metronidazole Hcl]     rash    Immunization History  Administered Date(s) Administered   Influenza Split 02/19/2004, 02/02/2005   Pneumococcal Polysaccharide-23 12/22/2004, 08/11/2011   Zoster Recombinant(Shingrix) 07/26/2022    Past Medical History:  Diagnosis Date   Allergic rhinitis 10/19/2021   Bronchiectasis 06/17/2021   Cerebrovascular disease    Moderately-severe per 2023 brain MRI   Chronic lymphocytic leukemia of B-cell type in remission    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  We discussed repeat injection today and she is interested in pursuing this on a more regular basis to control her symptoms 2.  Steroid injection placed right CMC joint 20 mg Depo-Medrol  1/2 cc 1% Xylocaine  with bicarb after Betadine skin prep well-tolerated 3.  Follow-up in 4 to 22-month intervals f   Protein calorie malnutrition 08/03/2023   Pseudophakia of both eyes 02/15/2021   Seizure-like activity (HCC) 11/29/2021   Syncope and collapse 02/11/2022    Tobacco History: Social History   Tobacco Use  Smoking Status Former   Current packs/day: 0.00   Types: Cigarettes   Quit date: 04/12/1983   Years since quitting: 40.8  Smokeless Tobacco Never  Tobacco Comments   smoked x 20 yrs   Counseling given: Not Answered Tobacco comments: smoked x 20 yrs   Outpatient Medications Prior to Visit  Medication Sig Dispense Refill   arformoterol  (BROVANA ) 15 MCG/2ML NEBU Take 2 mLs (15 mcg total) by nebulization 2 (two) times daily. 120 mL 6   budesonide  (PULMICORT ) 0.5 MG/2ML nebulizer solution Take 2 mLs (0.5 mg total) by nebulization 2 (two) times daily. 360 mL 1   dorzolamide-timolol (COSOPT) 2-0.5 % ophthalmic solution Place 1 drop into both eyes 2 (two) times daily.     fluticasone  (FLONASE ) 50 MCG/ACT nasal spray Place 2 sprays into both nostrils daily. 16 g 2   ibuprofen (ADVIL) 200 MG tablet Take 200 mg by mouth every 8 (eight) hours as needed.     loratadine  (CLARITIN ) 5 MG chewable tablet Chew 5 mg by mouth daily.     meclizine (ANTIVERT) 25 MG tablet Take 25 mg by mouth 3 (three) times daily as needed for dizziness.     RHOPRESSA 0.02 % SOLN Place 1 drop into both eyes daily.     albuterol  (PROVENTIL ) (2.5 MG/3ML) 0.083% nebulizer  solution Take 2.5 mg by nebulization 2 (two) times daily. (Patient not taking: Reported on 02/05/2024)     aspirin 81 MG chewable tablet Chew 81 mg by mouth daily. (Patient not taking: Reported on 02/05/2024)     co-enzyme Q-10 30 MG capsule Take by mouth. (Patient not taking: Reported on 02/05/2024)     FLUoxetine (PROZAC) 10 MG capsule Take 10 mg by mouth daily.     latanoprost (XALATAN) 0.005 % ophthalmic solution Place 1 drop into both eyes at bedtime. (Patient not taking: Reported on 02/05/2024)     montelukast (SINGULAIR) 10 MG tablet Take 10 mg by mouth daily. (Patient not taking: Reported on 02/05/2024)     pantoprazole (PROTONIX) 40 MG tablet Take 40 mg by mouth daily. (Patient not taking: Reported on 02/05/2024)     rivastigmine  (EXELON ) 3 MG capsule Take 1 capsule (3 mg total) by mouth 2 (two) times daily. (Patient not taking: Reported on 02/05/2024) 180 capsule 3   No facility-administered medications prior to visit.     Review of Systems:   Constitutional:   No  weight loss, night sweats,  Fevers, chills, +fatigue, or  lassitude.  HEENT:   No headaches,  Difficulty swallowing,  Tooth/dental problems,  or  Sore throat,                No sneezing, itching, ear ache, nasal congestion, post nasal drip,   CV:  No chest pain,  Orthopnea, PND, swelling in lower extremities, anasarca, dizziness, palpitations, syncope.   GI  No heartburn, indigestion, abdominal pain, nausea, vomiting, diarrhea, change in bowel habits, loss of appetite, bloody stools.   Resp: .  No chest wall deformity  Skin: no rash or lesions.  GU: no dysuria, change in color of urine, no urgency or frequency.  No flank pain, no hematuria   MS:  No joint pain or swelling.  No decreased range of motion.  No back pain.    Physical Exam  BP 94/66   Pulse 78   Temp (!) 97.5 F (36.4 C)   Ht 5' 2 (1.575 m) Comment: Per pt  Wt 90 lb (40.8 kg)   LMP  (LMP Unknown)   SpO2 96% Comment: RA  BMI 16.46 kg/m    GEN: A/Ox3; pleasant , NAD, thin, frail, BMI 16   HEENT:  [place]/AT,   NOSE-clear, THROAT-clear, no lesions, no postnasal drip or exudate noted.   NECK:  Supple w/ fair ROM; no JVD; normal carotid impulses w/o bruits; no thyromegaly or nodules palpated; no lymphadenopathy.    RESP  Clear  P & A; w/o, wheezes/ rales/ or rhonchi. no accessory muscle use, no dullness to percussion  CARD:  RRR, no m/r/g, no peripheral edema, pulses intact, no cyanosis or clubbing.  GI:   Soft & nt; nml bowel sounds; no organomegaly or masses detected.   Musco: Warm bil, no deformities or joint swelling noted.   Neuro: alert, no focal deficits noted.    Skin: Warm, no lesions or rashes    Lab Results:Reviewed 02/05/2024     BNP No results found for: BNP  ProBNP No results found for: PROBNP  Imaging: No results found.  Administration History     None          Latest Ref Rng & Units 07/23/2021   10:06 AM  PFT Results  FVC-Pre L 2.31   FVC-Predicted Pre % 82   FVC-Post L 2.29   FVC-Predicted Post % 81   Pre FEV1/FVC % % 57   Post FEV1/FCV % % 51   FEV1-Pre L 1.31   FEV1-Predicted Pre % 62   FEV1-Post L 1.17   DLCO uncorrected ml/min/mmHg 9.99   DLCO UNC% % 52   DLCO corrected ml/min/mmHg 9.99   DLCO COR %Predicted % 52   DLVA Predicted % 80   TLC L 5.50   TLC % Predicted % 108   RV % Predicted % 142     No results found for: NITRICOXIDE      No data to display              Assessment & Plan:   Assessment and Plan Assessment & Plan Bronchiectasis with chronic cough and history recurrent infections   Chronic bronchiectasis presents with daily cough and recurrent infections . Symptoms include a daily dry, unproductive cough with occasional green mucus, . A recent CT showed mild diffuse bronchiectasis with increased mucus plugging and airway inflammation. Prescribe Brinsupri 25 mg, monitor for side effects and insurance coverage. Continue nebulizer treatments  with budesonide  and Brovana  twice daily. Use a Flutter valve after nebulizer treatments to aid mucus clearance. Order a chest x-ray to assess for any acute changes. Patient enrollment form link sent to  patient and sent to our specialty pharmacy.   Protein calorie malnutrition.  BMI 16.-Ongoing weight loss. Ongoing unintentional weight loss contributes to muscle mass loss, weakness, and fatigue. She has a poor appetite and low motivation to eat, preferring limited foods like caffeine-free Pepsi and bacon biscuits. Protein intake is important to counteract weight loss and muscle wasting. Encourage intake of protein-rich foods and supplements, such as protein smoothies and shakes. Monitor weight and nutritional status.  Osteoporosis (suspected)   Suspected osteoporosis raises concerns about using vest therapy for bronchiectasis due to potential fragility. Weight loss and low body mass increase the risk of osteoporosis-related complications. Monitor bone health and consider osteoporosis management if confirmed. Hold on Vest therapy at this time.   Cognitive impairment   Cognitive impairment is present, with previous testing showing worsening condition. A neurologist appointment is scheduled in December to discuss potential medication adjustments, including rivastigmine . Current management is complicated by weight loss and nausea, leading to medication adjustments. Follow up with the neurologist in December to discuss cognitive management.        Carlisia Geno, NP 02/05/2024  I spent 44 minutes dedicated to the care of this patient on the date of this encounter to include pre-visit review of records, face-to-face time with the patient discussing conditions above, post visit ordering of testing, clinical documentation with the electronic health record, making appropriate referrals as documented, and communicating necessary findings to members of the patients care team.

## 2024-02-05 NOTE — Telephone Encounter (Signed)
 FYI

## 2024-02-05 NOTE — Telephone Encounter (Signed)
 Received notification via telephone encounter that pt is Brinsupri new start. Submitted a Prior Authorization request to [employer] for BRINSUPRI via CoverMyMeds. Will update once we receive a response.  Key: [id number]

## 2024-02-05 NOTE — Patient Instructions (Addendum)
 Robitussin  1 tsp every 4hr as needed for congestion  Continue on Flutter valve Twice daily  after neb use.  Continue on Budesonide  and Brovana  Neb Twice daily   Chest xray today.  Begin Brensocatib 25mg  daily.  Albuterol  neb As needed   High protein diet, try protein supplement daily.  Follow up in 3 months with Dr. Shelah or Brigham Cobbins NP and As needed

## 2024-02-05 NOTE — Telephone Encounter (Signed)
 Attention pharmacy team, I have prescribed Brinsupri 25mg  daily for this patient. We have sent the patient link to sign up on line to the daughters to fill out. We will send the provider rx form to pharm team today. Please let me know if anything else is needed.

## 2024-02-06 NOTE — Telephone Encounter (Signed)
 Received fax from [facility] requesting additional info for the pa. 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

## 2024-02-06 NOTE — Telephone Encounter (Signed)
 Rx was initially sent to [facility]. This is LDD, so cancelled Rx and will await result of Benefits investigation, then send to appropriate pharmacy.

## 2024-02-07 ENCOUNTER — Other Ambulatory Visit (HOSPITAL_COMMUNITY): Payer: Self-pay

## 2024-02-07 ENCOUNTER — Telehealth: Payer: Self-pay

## 2024-02-07 NOTE — Telephone Encounter (Addendum)
 Received notification from [facility] regarding a prior authorization for BRINSUPRI. Authorization has been APPROVED from 02/06/24 to 04/10/25. Approval letter sent to scan center.  Per test claim, copay for 30 days supply is $1126.09  Authorization # [id number] Phone # [phone]   Have hardcopy of the enrollment form that is filled out but does not have pt's signature. LVM with pt's daughter to see if she is able to come in to sign.

## 2024-02-07 NOTE — Telephone Encounter (Signed)
 See pharmacy telephone encounter from 02/05/24 NFN

## 2024-02-07 NOTE — Telephone Encounter (Signed)
 Received call back from pt's daughter-in-law [name]. She stated they live in [place] and she is able to sign the form via [employer]. Emailed form through [employer] to [email or web address]. Will await completion of signatures.

## 2024-02-07 NOTE — Telephone Encounter (Signed)
 Received sign form back from pt. Faxed completed form with insurance card, med list, and pa approval letter to [employer]. Will update when we receive a response.  Phone #: [phone] Fax #: [phone]

## 2024-02-08 ENCOUNTER — Ambulatory Visit: Payer: Self-pay | Admitting: Adult Health

## 2024-02-08 NOTE — Telephone Encounter (Signed)
 Received fax from [employer] - Enrollment form received.   Phone # [phone]  Patient ID [id number]

## 2024-02-08 NOTE — Telephone Encounter (Signed)
 Received fax from [facility] confirming receipt of rx.  Phone #: [phone] Fax #: [phone]

## 2024-03-19 ENCOUNTER — Encounter: Payer: Self-pay | Admitting: Neurology

## 2024-03-19 ENCOUNTER — Ambulatory Visit: Admitting: Neurology

## 2024-03-19 VITALS — BP 143/76 | HR 85 | Ht 62.0 in | Wt 92.2 lb

## 2024-03-19 DIAGNOSIS — G309 Alzheimer's disease, unspecified: Secondary | ICD-10-CM

## 2024-03-19 DIAGNOSIS — F0280 Dementia in other diseases classified elsewhere without behavioral disturbance: Secondary | ICD-10-CM

## 2024-03-19 MED ORDER — MEMANTINE HCL ER 14 MG PO CP24: ORAL_CAPSULE | ORAL | 6 refills | Status: AC

## 2024-03-19 MED ORDER — MEMANTINE HCL ER 7 MG PO CP24: ORAL_CAPSULE | ORAL | 0 refills | Status: AC

## 2024-03-19 NOTE — Progress Notes (Signed)
 NEUROLOGY FOLLOW UP OFFICE NOTE  Jenna Rasmussen [id number] 1945/04/30  Discussed the use of AI scribe software for clinical note transcription with the patient, who gave verbal consent to proceed.  History of Present Illness I had the pleasure of seeing Jenna Rasmussen in follow-up in the neurology clinic on 03/19/2024.  The patient was last seen 7 months ago for syncope and dementia. She is again accompanied by her daughter-in-law [name] who helps supplement the history today.  Records and images were personally reviewed where available.  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 It was noted that family had taken over medications, finances, and driving. Concerns about decline in hygiene and task initiation were also reported.  We discussed results of Neurocognitive testing. She does not feel she has lost memory and denies missing medications. She was getting slightly agitated during the visit due to this. [name] does a good job with reassuring and explaining things with her to calm her down. She was previously on donepezil , which caused nausea, and then switched to rivastigmine , which she has not been taking regularly as well. Family was concerned that despite them fixing her pillbox,she would not take them regularly. She is currently not on any memory medication.   She has a caregiver, Orlean, who assists her three afternoons a week with medication management, meal preparation, and light housekeeping. She does not enjoy having someone around for extended periods, feeling the need to entertain her despite repeated explanations otherwise. She is ambulatory and can manage her personal care, including bathing, with some reminders from [place] so she can do it regularly. She walks around her house for exercise but prefers not to go  outside due to the cold weather. She is not interested in exercise programs or activities to do at home, she likes to read.  No syncopal episodes since 01/2022. She denies any headaches, [name] reports there can be days where she reports her head is in a fog. No dizziness, focal numbness/tingling/weakness. She has not been reporting the nausea recently. She sleeps well, going to bed around 10:30 PM and waking up around 11 AM. Her mood is stable, no paranoia or hallucinations.     History on Initial Assessment 02/16/2022: This is a pleasant 78 year old right-handed woman with a history of hyperlipidemia, CLL in remission, presenting for evaluation of syncope and memory loss. They report that after she had Covid in 03/2021, she has been complaining of generally feeling unwell since. She would say I can feel it in my head. Previously, she had lost her husband in June 2021 and had episodes of vertigo affecting daily activities, spending her day in bed due to anxiety of having a fall, then she had endolymphatic surgery in 11/2020 and the vertigo resolved completely. She however has had underlying anxiety/worry when she got Covid and had seen Pulmonary thinking she had an infection but did not feel any better. On 11/29/21, she was a passenger in the car and told her daughter she was not feeling well, asking to be taken home and that she needed to go to the bathroom. She then passed out and woke up to EMS around her. Her daughter reported her head went back, eyes rolled back and arms flexed, she was rigid and had shaking, making a very vocal sound (gurgling moan), then she was just moaning after. This lasted less  than 3 minutes, she was waking up as EMS opened her door. No tongue bite or incontinence. She does nor recall feeling dizzy, no headache, focal numbness/tingling/weakness. She was very sleepy when she got to the hospital, she was nauseated and vomited in the hospital. She was admitted overnight at [facility] where brain MRI without contrast showed chronic microvascular changes moderately severe in the cerebral white matter, progressed from prior MRI done 04/2020. 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 There was mild generalized cerebral atrophy. Carotid dopplers no significant stenosis, echocardiogram was normal. Zio patch did not show any sustained arrhythmia or prolonged pauses, occasional PVCs and short SV/V runs were present. She was seen by Cardiology in follow-up. On 02/03/22, she had another syncopal episode. She was with her realtor looking at a house, she recalls coming out and felt like she missed a step. She said she would just sit for a minute, then fel like she was getting lightheaded with the world fading. The realtor reported she was stepping down steps and sat down hard on the ground. Realtor helped her lean against the wall and asked her questions that she was able to answer. Her realtor went in to get a rag and could tell she was going to fall over so she sat next to her as she briefly passed out with a little bit of jerking. She threw up when EMS arrived and faded out again with EMS, saying she wanted to go home. She was pale with note of HR was in the upper 30s/low 40s. In the ER, HR was in the 50s. She was seen by a different cardiologist and a tilt table test has been ordered. No further syncopal episodes since then.  Family has also been concerned about her memory. They started noticing some confusion, repeating herself, since her husband passed away in 2020-05-01. At that time, she was taking Xanax. It was stopped and symptoms got better but did not go away completely. She lives alone. She denies missing medications or bill payments. [name] notes though that she could not tell family exactly what medication she took or when she took it. She was previously driving and denied getting lost driving. She denies leaving the  stove on. Her paternal grandmother and several aunts had dementia. In January 2023, [name] recalls a strange episode. As previously mentioned, she had been feeling bad since her Covid infection the year prior, they were at a family gathering when she became very distant/removed, not answering when people asked questions but looking at them. She told them she was not feeling well with a headache, got up, then lay on the floor of her bedroom, which is unusual. She went to sleep on the floor for 30-45 minutes. Family has not noticed any staring/unresponsive episodes. She denies any other gaps in time. She denies any olfactory/gustatory hallucinations, deja vu, rising epigastric sensation, focal numbness/tingling/weakness, myoclonic jerks. Prior to her Covid infection, she did not have a history of headaches. Since then, she has had headaches or complains of her head not feeling right 5 out of 7 days in a week. She takes Tylenol  which does not help, weather changes seem to affect her. She states something feels heavy and does not feel normal. She describes a pressure in her head, she can still do her daily activities but family has noticed she does not do her chores as often, or making bread/going to grocery, like before. She denies  any dizziness, diplopia, dysarthria/dysphagia, neck/back pain, bowel/bladder dysfunction, anosmia, or tremors. Her nephew has seizures. She had a normal birth and early development.  There is no history of febrile convulsions, CNS infections such as meningitis/encephalitis, significant traumatic brain injury, neurosurgical procedures.  Bloodwork at PCP office in 12/2021 showed an LDL of 58, total cholesterol of 137. Bloodwork in 05/2021 showed a normal B12 of 381, normal TSH.   EEG in 02/2022 normal.  Neuropsychological testing in 07/2022 indicated prominent impairment surrounding both semantic fluency and all aspects of learning and memory, she denied any difficulties functionally and  was diagnosed with Mild Neurocognitive Disorder, with concern for underlying etiology of Alzheimer's disease worsened by cerebrovascular factors (mixed presentation).   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 It was noted that family had taken over medications, finances, and driving. Concerns about decline in hygiene and task initiation were also reported.  Brain MRI in 11/2021 showed moderately severe chronic microvascular disease, no acute changes. She was started on Donepezil  10mg  but started having nausea and frequent BMs so she has been taking only 1/2 tablet daily but with continued symptoms. She had been referred to GI due to this.   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  We discussed repeat injection today and she is interested in pursuing this on a more regular basis to control her symptoms 2.  Steroid injection placed right CMC joint 20 mg Depo-Medrol  1/2 cc 1% Xylocaine  with bicarb after Betadine skin prep well-tolerated 3.  Follow-up in 4 to 76-month intervals f   Protein calorie malnutrition 08/03/2023    Pseudophakia of both eyes 02/15/2021   Seizure-like activity (HCC) 11/29/2021   Syncope and collapse 02/11/2022    MEDICATIONS: Current Outpatient Medications on File Prior to Visit  Medication Sig Dispense Refill   albuterol  (PROVENTIL ) (2.5 MG/3ML) 0.083% nebulizer solution Take 2.5 mg by nebulization every 6 (six) hours as needed.     arformoterol  (BROVANA ) 15 MCG/2ML NEBU Take 2 mLs (15 mcg total) by nebulization 2 (two) times daily. 120 mL 6   budesonide  (PULMICORT ) 0.5 MG/2ML nebulizer solution Take 2 mLs (0.5 mg total) by nebulization 2 (two) times daily. 360 mL 1   dorzolamide-timolol (COSOPT) 2-0.5 % ophthalmic solution Place 1 drop into both eyes 2 (two) times daily.     fluticasone  (FLONASE ) 50 MCG/ACT nasal spray Place 2 sprays into both nostrils daily. 16 g 2   ibuprofen (ADVIL) 200 MG tablet Take 200 mg by mouth every 8 (eight) hours as needed.     loratadine  (CLARITIN ) 5 MG chewable tablet Chew 5 mg by mouth daily.     meclizine (ANTIVERT) 25 MG tablet Take 25 mg by mouth 3 (three)  times daily as needed for dizziness.     RHOPRESSA 0.02 % SOLN Place 1 drop into both eyes daily.     rivastigmine  (EXELON ) 3 MG capsule Take 1 capsule (3 mg total) by mouth 2 (two) times daily. (Patient not taking: Reported on 02/05/2024) 180 capsule 3   No current facility-administered medications on file prior to visit.    ALLERGIES: Allergies  Allergen Reactions   Rifampin     Liver problems and patient ended up in hospital   Codeine     Dizziness stomach upset   Flagyl [Metronidazole Hcl]     rash    FAMILY HISTORY: Family History  Problem Relation Age of Onset   Heart disease Father    Heart attack Father    Hyperlipidemia Father    Dementia Paternal Aunt        several aunts w/dementia and concerns for Alzheimer's disease; onset likely early 4s    SOCIAL HISTORY: Social History   Socioeconomic History   Marital status: Widowed    Spouse name: Not on file   Number of  children: Not on file   Years of education: 12   Highest education level: High school graduate  Occupational History   Occupation: Retired    Comment: Investment Banker, Corporate  Tobacco Use   Smoking status: Former    Current packs/day: 0.00    Types: Cigarettes    Quit date: 04/12/1983    Years since quitting: 40.9   Smokeless tobacco: Never   Tobacco comments:    smoked x 20 yrs  Vaping Use   Vaping status: Never Used  Substance and Sexual Activity   Alcohol  use: Never   Drug use: Never   Sexual activity: Not on file  Other Topics Concern   Not on file  Social History Narrative   Are you right handed or left handed? Right handed   Are you currently employed ? Retired    What is your current occupation? NA   Do you live at home alone? alone   Who lives with you? NA   What type of home do you live in: 1 story or 2 story? 1 story        Social Drivers of Corporate Investment Banker Strain: Not on file  Food Insecurity: Not on file  Transportation Needs: Not on file  Physical Activity: Not on file  Stress: Not on file  Social Connections: Not on file  Intimate Partner Violence: Not on file     PHYSICAL EXAM: Vitals:   03/19/24 1337  BP: (!) 143/76  Pulse: 85  SpO2: 93%   General: No acute distress Head:  Normocephalic/atraumatic Skin/Extremities: No rash, no edema Neurological Exam: alert and awake. No aphasia or dysarthria. Fund of knowledge is appropriate. Attention and concentration are normal.   Cranial nerves: Pupils equal, round. Extraocular movements intact. Visual fields full.  No facial asymmetry.  Motor: moves all extremities symmetrically at least anti-gravity x 4. Gait narrow-based and steady, no ataxia. No tremors.   IMPRESSION: This is a 78 yo RH woman with a history of hyperlipidemia, CLL in remission, who presented for syncope and memory loss. No further syncopal episodes since 01/2022. We discussed repeat Neuropsychological testing dones 10/2023 which showed  progression to dementia, likely mixed AD and vascular, with Alzheimer's disease being the primary driving force for dysfunction and decline. She has decreased insight into her condition and feels memory is fine. She is not happy with having a companion at home, we  discussed benefits and indication (need for increased assistance which she does not feel is required). We discussed switching to extended-release Memantine  to help with compliance, start 7mg  daily for 2 weeks, then 14mg  daily. Side effects discussed, we may uptitrate as tolerated. Continue close supervision. She does not drive. Follow-up in 6 months,call for any changes.   Thank you for allowing me to participate in her care.  Please do not hesitate to call for any questions or concerns.    Darice Shivers, M.D.   CC: Cena Buttner, [place]

## 2024-03-19 NOTE — Patient Instructions (Addendum)
 Good to see you.  A prescription was sent for extended-release Memantine : take 7mg  capsule daily for 2 weeks, then take 14mg  tablet daily. Please update me via MyChart on any issues. We may increase dose as tolerated   2. Continue working on getting regular exercise, meals.   3. Follow-up in 6 months or earlier if needed, call for any changes   FALL PRECAUTIONS: Be cautious when walking. Scan the area for obstacles that may increase the risk of trips and falls. When getting up in the mornings, sit up at the edge of the bed for a few minutes before getting out of bed. Consider elevating the bed at the head end to avoid drop of blood pressure when getting up. Walk always in a well-lit room (use night lights in the walls). Avoid area rugs or power cords from appliances in the middle of the walkways. Use a walker or a cane if necessary and consider physical therapy for balance exercise. Get your eyesight checked regularly.   HOME SAFETY: Consider the safety of the kitchen when operating appliances like stoves, microwave oven, and blender. Consider having supervision and share cooking responsibilities until no longer able to participate in those. Accidents with firearms and other hazards in the house should be identified and addressed as well.   ABILITY TO BE LEFT ALONE: If patient is unable to contact 911 operator, consider using LifeLine, or when the need is there, arrange for someone to stay with patients. Smoking is a fire hazard, consider supervision or cessation. Risk of wandering should be assessed by caregiver and if detected at any point, supervision and safe proof recommendations should be instituted.  MEDICATION SUPERVISION: Inability to self-administer medication needs to be constantly addressed. Implement a mechanism to ensure safe administration of the medications.  RECOMMENDATIONS FOR ALL PATIENTS WITH MEMORY PROBLEMS: 1. Continue to exercise (Recommend 30 minutes of walking everyday,  or 3 hours every week) 2. Increase social interactions - continue going to [place] and enjoy social gatherings with friends and family 3. Eat healthy, avoid fried foods and eat more fruits and vegetables 4. Maintain adequate blood pressure, blood sugar, and blood cholesterol level. Reducing the risk of stroke and cardiovascular disease also helps promoting better memory. 5. Avoid stressful situations. Live a simple life and avoid aggravations. Organize your time and prepare for the next day in anticipation. 6. Sleep well, avoid any interruptions of sleep and avoid any distractions in the bedroom that may interfere with adequate sleep quality 7. Avoid sugar, avoid sweets as there is a strong link between excessive sugar intake, diabetes, and cognitive impairment The Mediterranean diet has been shown to help patients reduce the risk of progressive memory disorders and reduces cardiovascular risk. This includes eating fish, eat fruits and green leafy vegetables, nuts like almonds and hazelnuts, walnuts, and also use olive oil. Avoid fast foods and fried foods as much as possible. Avoid sweets and sugar as sugar use has been linked to worsening of memory function.  There is always a concern of gradual progression of memory problems. If this is the case, then we may need to adjust level of care according to patient needs. Support, both to the patient and caregiver, should then be put into place.

## 2024-04-16 ENCOUNTER — Other Ambulatory Visit: Payer: Self-pay | Admitting: Emergency Medicine

## 2024-05-08 ENCOUNTER — Ambulatory Visit: Admitting: Emergency Medicine

## 2024-06-05 ENCOUNTER — Ambulatory Visit: Admitting: Emergency Medicine

## 2024-10-03 ENCOUNTER — Ambulatory Visit: Admitting: Neurology
